# Patient Record
Sex: Female | Born: 1995 | Race: White | Hispanic: No | Marital: Single | Smoking: Never smoker
Health system: Southern US, Community
[De-identification: ages and names within clinical notes are randomized; demographics above are authoritative.]

## PROBLEM LIST (undated history)

## (undated) DIAGNOSIS — R569 Unspecified convulsions: Secondary | ICD-10-CM

---
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
## 2014-10-13 ENCOUNTER — Telehealth: Payer: Self-pay | Admitting: *Deleted

## 2014-10-13 NOTE — Telephone Encounter (Signed)
Santiago Glad, mom, stated the pt has seen Dr. Gaynell Face but has not seen him in the past year, since the pt is fine. The mother said they have to fill out the [facility] forms fill out this year to keep her license. The forms were filled out by this office last year. The mother would like to know if the forms can be filled out again this year. The pt does have a follow up with Dr[facility]ia [facility]uff at [facility] pt will see the doctor when the due date for the forms will pass. Or does she need to give it to Dr. Clovia Cuff. The mother can be reached at [phone].

## 2014-10-13 NOTE — Telephone Encounter (Signed)
She was last seen in April 2013. 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 Dr Gaynell Face, are you willing to complete the form without a visit this year? Otila Kluver

## 2014-10-13 NOTE — Telephone Encounter (Signed)
Erica Brandt, mom, stated that she got in contact with Dr. Dan Humphreys office. She said that they will fill out the [facility] form. She said there was no need to call back and said Thank you

## 2014-10-14 NOTE — Telephone Encounter (Signed)
Noted, thank you

## 2015-03-31 ENCOUNTER — Telehealth: Payer: Self-pay | Admitting: *Deleted

## 2015-03-31 NOTE — Telephone Encounter (Signed)
Unable to reach patient at time of Pre-Visit Call.  Left message for patient to return call when available.    

## 2015-04-01 ENCOUNTER — Encounter: Payer: Self-pay | Admitting: Family

## 2015-04-01 ENCOUNTER — Ambulatory Visit (INDEPENDENT_AMBULATORY_CARE_PROVIDER_SITE_OTHER): Payer: BLUE CROSS/BLUE SHIELD | Admitting: Family

## 2015-04-01 VITALS — BP 112/72 | HR 67 | Temp 97.2°F | Resp 16 | Ht 65.5 in | Wt 131.2 lb

## 2015-04-01 DIAGNOSIS — Z0000 Encounter for general adult medical examination without abnormal findings: Secondary | ICD-10-CM | POA: Diagnosis not present

## 2015-04-01 NOTE — Progress Notes (Signed)
Subjective:    Patient ID: Erica Brandt, female    DOB: 1996/04/14, 19 y.o.   MRN: [id number]  HPI  Erica Brandt is a 19 year old female who presents today to establish care.   Immunizations:   Diet:  Reports healthy diet.   Exercise: runs, [place] track, biking Reports that she had gardisil series.   Dental: reports last visit was 10/15, has apt next week.  Eye: due- advised to schedule routine eye exam  Seizures- age 58 was told simple partial seizures. Was on meds for a time. Reports that summer 2012 she has 40-50 in 1 week.  Had one episode with LOC.  Was told that she had a brain tumor, ganglioglioma 2012.  She was seen by neuro for 6-9 months.  She continues to see Erica Brandt at [facility] for [place].  Acne- on acutane- Sees Erica Brandt at [facility]. Not sexually active.     Review of Systems  Constitutional: Negative for unexpected weight change.  HENT: Negative for hearing loss and rhinorrhea.   Eyes: Negative for visual disturbance.  Respiratory: Negative for cough.   Cardiovascular: Negative for leg swelling.  Gastrointestinal: Negative for nausea, diarrhea and constipation.  Genitourinary: Negative for dysuria, frequency and menstrual problem.  Musculoskeletal: Negative for myalgias.       Mild joint pain which she attributes to accutane   Skin: Negative for rash.  Neurological: Negative for headaches.  Hematological: Negative for adenopathy.  Psychiatric/Behavioral: Negative for dysphoric mood and agitation.   Past Medical History  Diagnosis Date  . Seizures     last one 02/2012 r/t soccer accident- no meds f/u w/ neuro all Normal; most seizures prior to brain surgery    History   Social History  . Marital Status: Single    Spouse Name: N/A  . Number of Children: N/A  . Years of Education: N/A   Occupational History  . Not on file.   Social History Main Topics  . Smoking status: Never Smoker   . Smokeless tobacco: Never  Used  . Alcohol Use: No  . Drug Use: No  . Sexual Activity: No   Other Topics Concern  . Not on file   Social History Narrative   Lives with mom, dad, brother and sister   Erica Brandt- starting nursing program   Enjoys reading, piano, works at a summer camp, staying active, spending time with friends/family   Dog        Past Surgical History  Procedure Laterality Date  . Hymenectomy  11/30/2012    Procedure: HYMENECTOMY;  Surgeon: Erica Kenner, DO;  Location: [facility];  Service: Gynecology;  Laterality: N/A;  . Brain tumor excision  06/27/11    Family History  Problem Relation Age of Onset  . Hyperlipidemia Mother     diet controlled  . Cancer Paternal Grandfather     metastatic prostate cancer    No Known Allergies  No current outpatient prescriptions on file prior to visit.   No current facility-administered medications on file prior to visit.    BP 112/72 mmHg  Pulse 67  Temp(Src) 97.2 F (36.2 C) (Oral)  Resp 16  Ht 5' 5.5" (1.664 m)  Wt 131 lb 3.2 oz (59.512 kg)  BMI 21.49 kg/m2  SpO2 98%  LMP 03/02/2015       Objective:   Physical Exam  Physical Exam  Constitutional: She is oriented to person, place, and time. She appears well-developed and well-nourished. No distress.  HENT:  Head: Normocephalic and atraumatic.  Right Ear: Tympanic membrane and ear canal normal.  Left Ear: Tympanic membrane and ear canal normal.  Mouth/Throat: Oropharynx is clear and moist.  Eyes: Pupils are equal, round, and reactive to light. No scleral icterus.  Neck: Normal range of motion. No thyromegaly present.  Cardiovascular: Normal rate and regular rhythm.   No murmur heard. Pulmonary/Chest: Effort normal and breath sounds normal. No respiratory distress. He has no wheezes. She has no rales. She exhibits no tenderness.  Abdominal: Soft. Bowel sounds are normal. He exhibits no distension and no mass. There is no tenderness. There is no rebound and no guarding.    Musculoskeletal: She exhibits no edema.  Lymphadenopathy:    She has no cervical adenopathy.  Neurological: She is alert and oriented to person, place, and time. She has normal patellar reflexes. She exhibits normal muscle tone. Coordination normal.  Skin: Skin is warm and dry.  Psychiatric: She has a normal mood and affect. Her behavior is normal. Judgment and thought content normal.  Breasts: Examined lying Right: Without masses, retractions, discharge or axillary adenopathy.  Left: Without masses, retractions, discharge or axillary adenopathy.  Pelvic: deferred        Assessment & Plan:         Assessment & Plan:

## 2015-04-01 NOTE — Patient Instructions (Signed)
Continue healthy diet, exercise. Please bring [place] copies of your immunizations for review. Follow up in 1 year, sooner if problems/concerns.

## 2015-04-01 NOTE — Progress Notes (Signed)
Pre visit review using our clinic review tool, if applicable. No additional management support is needed unless otherwise documented below in the visit note. 

## 2015-04-01 NOTE — Assessment & Plan Note (Addendum)
Continue healthy diet, exercise. Advised pt not to become pregnant on accutane and she verbalizes understanding. Obtain routine lab work. Paps to start at 20- pt has a gyn.

## 2015-04-02 LAB — BASIC METABOLIC PANEL
BUN: 10 mg/dL (ref 6–23)
CO2: 31 mEq/L (ref 19–32)
CREATININE: 0.88 mg/dL (ref 0.40–1.20)
Calcium: 9.9 mg/dL (ref 8.4–10.5)
Chloride: 104 mEq/L (ref 96–112)
GFR: 86.79 mL/min (ref >60.00)
GLUCOSE: 84 mg/dL (ref 70–99)
POTASSIUM: 4.2 meq/L (ref 3.5–5.1)
Sodium: 142 mEq/L (ref 135–145)

## 2015-04-02 LAB — CBC WITH DIFFERENTIAL/PLATELET
BASOS ABS: 0.1 10*3/uL (ref 0.0–0.1)
BASOS PCT: 0.8 % (ref 0.0–3.0)
Eosinophils Absolute: 0.1 10*3/uL (ref 0.0–0.7)
Eosinophils Relative: 1.7 % (ref 0.0–5.0)
HCT: 37.5 % (ref 36.0–46.0)
HEMOGLOBIN: 12.8 g/dL (ref 12.0–15.0)
LYMPHS PCT: 29.8 % (ref 12.0–46.0)
Lymphs Abs: 2.1 10*3/uL (ref 0.7–4.0)
MCHC: 34.2 g/dL (ref 30.0–36.0)
MCV: 90.4 fl (ref 78.0–100.0)
MONOS PCT: 9.3 % (ref 3.0–12.0)
Monocytes Absolute: 0.7 10*3/uL (ref 0.1–1.0)
NEUTROS ABS: 4.1 10*3/uL (ref 1.4–7.7)
Neutrophils Relative %: 58.4 % (ref 43.0–77.0)
Platelets: 258 10*3/uL (ref 150.0–400.0)
RBC: 4.15 Mil/uL (ref 3.87–5.11)
RDW: 12.5 % (ref 11.5–14.6)
WBC: 7 10*3/uL (ref 4.5–10.5)

## 2015-04-02 LAB — LIPID PANEL
CHOLESTEROL: 127 mg/dL (ref 0–200)
HDL: 41.4 mg/dL (ref >39.00)
LDL CALC: 69 mg/dL (ref 0–99)
NONHDL: 85.6
Total CHOL/HDL Ratio: 3
Triglycerides: 81 mg/dL (ref 0.0–149.0)
VLDL: 16.2 mg/dL (ref 0.0–40.0)

## 2015-04-02 LAB — URINALYSIS, ROUTINE W REFLEX MICROSCOPIC
BILIRUBIN URINE: NEGATIVE
Hgb urine dipstick: NEGATIVE
KETONES UR: NEGATIVE
Leukocytes, UA: NEGATIVE
NITRITE: NEGATIVE
SPECIFIC GRAVITY, URINE: 1.01 (ref 1.000–1.030)
Total Protein, Urine: NEGATIVE
Urine Glucose: NEGATIVE
Urobilinogen, UA: 0.2 (ref 0.0–1.0)
WBC, UA: NONE SEEN (ref 0–?)
pH: 7.5 (ref 5.0–8.0)

## 2015-04-02 LAB — HEPATIC FUNCTION PANEL
ALK PHOS: 94 U/L (ref 39–117)
ALT: 18 U/L (ref 0–35)
AST: 22 U/L (ref 0–37)
Albumin: 4.2 g/dL (ref 3.5–5.2)
BILIRUBIN DIRECT: 0.1 mg/dL (ref 0.0–0.3)
BILIRUBIN TOTAL: 0.4 mg/dL (ref 0.2–1.2)
Total Protein: 7.5 g/dL (ref 6.0–8.3)

## 2015-04-02 LAB — TSH: TSH: 1.37 u[IU]/mL (ref 0.35–5.50)

## 2015-04-03 ENCOUNTER — Encounter: Payer: Self-pay | Admitting: Family

## 2015-04-03 LAB — QUANTIFERON TB GOLD ASSAY (BLOOD)
INTERFERON GAMMA RELEASE ASSAY: NEGATIVE
QUANTIFERON NIL VALUE: 0.02 [IU]/mL
Quantiferon Tb Ag Minus Nil Value: 0.01 IU/mL
TB Ag value: 0.03 IU/mL

## 2015-11-26 ENCOUNTER — Telehealth: Payer: Self-pay | Admitting: Family

## 2015-11-26 DIAGNOSIS — Z87898 Personal history of other specified conditions: Secondary | ICD-10-CM

## 2015-11-26 NOTE — Telephone Encounter (Signed)
Relation to pt: self  Call back number:[phone] :  Reason for call:  Patient requesting a order for MRI at [facility] [address] [place], [place], [place] [address] [phone], routine check up regarding brain tumor. Patient appoitment is Tuesday 11/30/14 at 8:30a.    [facility]  ID # [id number]  Group # [id number] PCP line # [id number], [phone]

## 2015-11-27 NOTE — Telephone Encounter (Signed)
Please advise. Dr. Larose Kells covering today, however, new patient to you.

## 2016-04-20 ENCOUNTER — Telehealth: Payer: Self-pay | Admitting: Family

## 2016-04-20 DIAGNOSIS — Z111 Encounter for screening for respiratory tuberculosis: Secondary | ICD-10-CM

## 2016-04-20 NOTE — Telephone Encounter (Signed)
OK to come to the lab. Order placed.

## 2016-04-20 NOTE — Telephone Encounter (Signed)
Can be reached: [phone]   Reason for call: Pt called stating she needs tb quantiferon testing for school. Last OV 04/01/2015. Can we schedule for lab or does she need OV before?

## 2016-04-20 NOTE — Telephone Encounter (Signed)
Left detailed message on cell re: order placement and to call and schedule lab appt.

## 2016-04-26 ENCOUNTER — Other Ambulatory Visit (INDEPENDENT_AMBULATORY_CARE_PROVIDER_SITE_OTHER): Payer: 59

## 2016-04-26 DIAGNOSIS — Z111 Encounter for screening for respiratory tuberculosis: Secondary | ICD-10-CM | POA: Diagnosis not present

## 2016-04-28 LAB — QUANTIFERON TB GOLD ASSAY (BLOOD)
Interferon Gamma Release Assay: NEGATIVE
Mitogen-Nil: 8.57 IU/mL
QUANTIFERON TB AG MINUS NIL: 0 [IU]/mL
Quantiferon Nil Value: 0.02 IU/mL

## 2016-04-29 ENCOUNTER — Encounter: Payer: Self-pay | Admitting: Family

## 2016-07-12 ENCOUNTER — Telehealth: Payer: Self-pay

## 2016-07-12 NOTE — Telephone Encounter (Signed)
Patient requesting an appointment for  Tetanus/Tdap  Influenza Vaccine   Will she need an office visit of nurse visit for this appointment?

## 2016-07-13 ENCOUNTER — Ambulatory Visit (INDEPENDENT_AMBULATORY_CARE_PROVIDER_SITE_OTHER): Payer: 59 | Admitting: *Deleted

## 2016-07-13 DIAGNOSIS — Z23 Encounter for immunization: Secondary | ICD-10-CM | POA: Diagnosis not present

## 2016-07-13 NOTE — Progress Notes (Signed)
Pre visit review using our clinic review tool, if applicable. No additional management support is needed unless otherwise documented below in the visit note.  Patient tolerated injection well.  Dekisha Mesmer J Bellina Tokarczyk, RN 

## 2016-07-13 NOTE — Telephone Encounter (Signed)
Nurse visit please

## 2017-01-02 ENCOUNTER — Encounter: Payer: Self-pay | Admitting: Family

## 2017-01-02 DIAGNOSIS — Z8249 Family history of ischemic heart disease and other diseases of the circulatory system: Secondary | ICD-10-CM

## 2017-01-23 ENCOUNTER — Encounter: Payer: Self-pay | Admitting: Internal Medicine

## 2017-01-23 ENCOUNTER — Ambulatory Visit (INDEPENDENT_AMBULATORY_CARE_PROVIDER_SITE_OTHER): Payer: 59 | Admitting: Internal Medicine

## 2017-01-23 VITALS — BP 124/70 | HR 68 | Ht 66.0 in | Wt 142.4 lb

## 2017-01-23 DIAGNOSIS — Z8241 Family history of sudden cardiac death: Secondary | ICD-10-CM

## 2017-01-23 NOTE — Progress Notes (Signed)
Cardiology Office Note   Date:  01/23/2017   ID:  Erica Brandt, DOB [birth year], MRN [id number]  PCP:  Nance Pear., NP  Cardiologist:   Dorris Carnes, MD   Presetns for eval.  FHx of VF arrest      History of Present Illness: Erica Brandt is a 21 y.o. female with a history of seizures, gangioglioma  [facility] and in [facility]  Self referred  Mother had cardiac arrest    Fhx of cardac arrest  (mother)  In 2016  Prolonged QT  Pt deneis palpitations  Active  No dizziness  No syncope   Senior in nursing     No outpatient prescriptions have been marked as taking for the 01/23/17 encounter (Office Visit) with Fay Records, MD.     Allergies:   Patient has no known allergies.   Past Medical History:  Diagnosis Date  . Seizures ([place])    last one 02/2012 r/t soccer accident- no meds f/u w/ neuro all Normal; most seizures prior to brain surgery    Past Surgical History:  Procedure Laterality Date  . 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 HYMENECTOMY  11/30/2012   Procedure: HYMENECTOMY;  Surgeon: Allyn Kenner, DO;  Location: [facility];  Service: Gynecology;  Laterality: N/A;     Social History:  The patient  reports that she has never smoked. She has never used smokeless tobacco. She reports that she does not drink alcohol or use drugs.   Family History:  The patient's family history includes Cancer in her paternal grandfather; Heart attack in her mother; Hyperlipidemia in her mother.    ROS:  Please see the history of present illness. All other systems are reviewed and  Negative to the above problem except as noted.    PHYSICAL EXAM: VS:  BP 124/70   Pulse 68   Ht 5\' 6"  (1.676 m)   Wt 142 lb 6.4 oz (64.6 kg)   BMI 22.98 kg/m   GEN: Well nourished, well developed, in no acute distress  HEENT: normal  Neck: no JVD, carotid bruits, or masses Cardiac: RRR; no murmurs, rubs, or gallops,no edema  Respiratory:  clear  to auscultation bilaterally, normal work of breathing GI: soft, nontender, nondistended, + BS  No hepatomegaly  MS: no deformity Moving all extremities   Skin: warm and dry, no rash Neuro:  Strength and sensation are intact Psych: euthymic mood, full affect   EKG:  EKG is ordered today.  SR 69  QTc 412     Lipid Panel    Component Value Date/Time   CHOL 127 04/01/2015 1436   TRIG 81.0 04/01/2015 1436   HDL 41.40 04/01/2015 1436   CHOLHDL 3 04/01/2015 1436   VLDL 16.2 04/01/2015 1436   LDLCALC 69 04/01/2015 1436      Wt Readings from Last 3 Encounters:  01/23/17 142 lb 6.4 oz (64.6 kg)  04/01/15 131 lb 3.2 oz (59.5 kg)  11/27/12 135 lb (61.2 kg) (69 %, Z= 0.50)*   * Growth percentiles are based on CDC 2-20 Years data.      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  The patient does not have concerns regarding medicines.  Signed, Dorris Carnes, MD  01/23/2017 11:14 AM    [facility] [place], [place], [place]  [address] Phone: [phone]; Fax: [phone]

## 2017-01-23 NOTE — Patient Instructions (Signed)
Your physician has requested that you have an echocardiogram. Echocardiography is a painless test that uses sound waves to create images of your heart. It provides your doctor with information about the size and shape of your heart and how well your heart's chambers and valves are working. This procedure takes approximately one hour. There are no restrictions for this procedure.  Follow up with your physician will depend on test results.

## 2017-01-24 ENCOUNTER — Encounter: Payer: Self-pay | Admitting: Family

## 2017-01-24 DIAGNOSIS — L709 Acne, unspecified: Secondary | ICD-10-CM

## 2017-02-08 ENCOUNTER — Other Ambulatory Visit: Payer: Self-pay

## 2017-02-08 ENCOUNTER — Ambulatory Visit (HOSPITAL_COMMUNITY): Payer: 59 | Attending: Cardiovascular Disease

## 2017-02-08 DIAGNOSIS — Z8241 Family history of sudden cardiac death: Secondary | ICD-10-CM | POA: Diagnosis not present

## 2017-02-08 DIAGNOSIS — I341 Nonrheumatic mitral (valve) prolapse: Secondary | ICD-10-CM | POA: Diagnosis not present

## 2017-02-20 NOTE — Telephone Encounter (Signed)
Melissa--pt's insurance requires a referral from PCP. I have pended referral. I did not include a diagnosis due to below message from pt and was unsure of what to use? Please advise?

## 2017-02-20 NOTE — Telephone Encounter (Signed)
Relation to pt: self Call back number: [phone]    Reason for call:  Patient checking on the status of Dr, Mechele Dawley dermatology referral due to acne and keloids on left shoulder. Patient has a scheduled appointment on Wednesday 02/22/17. Patient would like to a call when referral has been sent.

## 2017-02-22 DIAGNOSIS — L700 Acne vulgaris: Secondary | ICD-10-CM | POA: Diagnosis not present

## 2017-02-22 DIAGNOSIS — L910 Hypertrophic scar: Secondary | ICD-10-CM | POA: Diagnosis not present

## 2017-06-05 ENCOUNTER — Ambulatory Visit (INDEPENDENT_AMBULATORY_CARE_PROVIDER_SITE_OTHER): Payer: 59 | Admitting: Family

## 2017-06-05 ENCOUNTER — Encounter: Payer: Self-pay | Admitting: Family

## 2017-06-05 ENCOUNTER — Other Ambulatory Visit (HOSPITAL_COMMUNITY)
Admission: RE | Admit: 2017-06-05 | Discharge: 2017-06-05 | Disposition: A | Discharge status: Home / Self Care | Payer: 59 | Source: Ambulatory Visit | Attending: Family Medicine | Admitting: Family Medicine

## 2017-06-05 VITALS — BP 108/71 | HR 75 | Temp 98.5°F | Resp 16 | Ht 66.0 in | Wt 136.6 lb

## 2017-06-05 DIAGNOSIS — L739 Follicular disorder, unspecified: Secondary | ICD-10-CM | POA: Diagnosis not present

## 2017-06-05 DIAGNOSIS — Z0000 Encounter for general adult medical examination without abnormal findings: Secondary | ICD-10-CM

## 2017-06-05 DIAGNOSIS — Z01419 Encounter for gynecological examination (general) (routine) without abnormal findings: Secondary | ICD-10-CM | POA: Diagnosis not present

## 2017-06-05 DIAGNOSIS — Z0001 Encounter for general adult medical examination with abnormal findings: Secondary | ICD-10-CM

## 2017-06-05 MED ORDER — CEPHALEXIN 500 MG PO CAPS: 500.0000 mg | ORAL_CAPSULE | Freq: Three times a day (TID) | ORAL | 0 refills | Status: DC

## 2017-06-05 NOTE — Progress Notes (Signed)
Subjective:    Patient ID: Erica Brandt, female    DOB: 1996/03/13, 21 y.o.   MRN: [id number]  HPI  Erica Brandt is a 21 yr old female who presents today for complete physical.   Immunizations: tdap up to date Diet:  healthy Exercise: enjoys running/weights, nodic track Pap Smear: due Vision: due Dental: up to date  Wt Readings from Last 3 Encounters:  06/05/17 136 lb 9.6 oz (62 kg)  01/23/17 142 lb 6.4 oz (64.6 kg)  04/01/15 131 lb 3.2 oz (59.5 kg)    Ingrown hair- has been there for several months.  Would like it evaluated.    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  MRI showed postsurgical changes in the right anterior temporal lobe. Otherwise was unremarkable. She is followed by Dr. Tivis Ringer.  She told me that she is following with him every other year at this point per his recommendation.   Review of Systems  Constitutional: Negative for unexpected weight change.  HENT: Negative for hearing loss and rhinorrhea.   Eyes: Negative for visual disturbance.  Respiratory: Negative for cough.   Cardiovascular: Negative for leg swelling.  Gastrointestinal: Negative for constipation and diarrhea.  Genitourinary: Negative for dysuria and frequency.  Musculoskeletal: Negative for arthralgias and myalgias.  Neurological:       Reports some mild headaches this week  Hematological: Negative for adenopathy.  Psychiatric/Behavioral:       Denies depression/anxiety       Past Medical History:  Diagnosis Date  . Seizures ([place])    last one 02/2012 r/t soccer accident- no meds f/u w/ neuro all Normal; most seizures prior to brain surgery     Social History   Social History  . Marital status: Single    Spouse name: N/A  . Number of children: N/A  . Years of education: N/A   Occupational History  . Not on file.   Social History Main Topics  . Smoking status: Never Smoker  . Smokeless tobacco: Never Used  . Alcohol use No  . Drug use: No    . Sexual activity: No   Other Topics Concern  . 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 HYMENECTOMY  11/30/2012   Procedure: HYMENECTOMY;  Surgeon: Allyn Kenner, DO;  Location: [facility];  Service: Gynecology;  Laterality: N/A;    Family History  Problem Relation Age of Onset  . Hyperlipidemia Mother        diet controlled  . Heart attack Mother   . Cancer Paternal Grandfather        metastatic prostate cancer    No Known Allergies  No current outpatient prescriptions on file prior to visit.   No current facility-administered medications on file prior to visit.     BP 108/71 (BP Location: Left Arm, Cuff Size: Normal)   Pulse 75   Temp 98.5 F (36.9 C) (Oral)   Resp 16   Ht 5\' 6"  (1.676 m)   Wt 136 lb 9.6 oz (62 kg)   LMP 04/30/2017   SpO2 98%   BMI 22.05  kg/m     Objective:   Physical Exam Physical Exam  Constitutional: She is oriented to person, place, and time. She appears well-developed and well-nourished. No distress.  HENT:  Head: Normocephalic and atraumatic.  Right Ear: Tympanic membrane and ear canal normal.  Left Ear: Tympanic membrane and ear canal normal.  Mouth/Throat: Oropharynx is clear and moist.  Eyes: Pupils are equal, round, and reactive to light. No scleral icterus.  Neck: Normal range of motion. No thyromegaly present.  Cardiovascular: Normal rate and regular rhythm.   No murmur heard. Pulmonary/Chest: Effort normal and breath sounds normal. No respiratory distress. He has no wheezes. She has no rales. She exhibits no tenderness.  Abdominal: Soft. Bowel sounds are normal. She exhibits no distension and no mass. There is no tenderness. There is  no rebound and no guarding.  Musculoskeletal: She exhibits no edema.  Lymphadenopathy:    She has no cervical adenopathy.  Neurological: She is alert and oriented to person, place, and time. She has normal patellar reflexes. She exhibits normal muscle tone. Coordination normal.  Skin: Skin is warm and dry. small pea sized ingrown hair follicle noted left groin Psychiatric: She has a normal mood and affect. Her behavior is normal. Judgment and thought content normal.  Breasts: Examined lying Right: Without masses, retractions, discharge or axillary adenopathy.  Left: Without masses, retractions, discharge or axillary adenopathy.  Inguinal/mons: Normal without inguinal adenopathy  External genitalia: Normal  BUS/Urethra/Skene's glands: Normal  Bladder: Normal  Vagina: Normal  Cervix: Normal  Uterus: normal in size, shape and contour. Midline and mobile  Adnexa/parametria:  Rt: Without masses or tenderness.  Lt: Without masses or tenderness.  Anus and perineum: Normal            Assessment & Plan:    Folliculitis- Rx with keflex and warm compresses bid. Pt is advised to call if no improvement.   Preventative care visit with abnormal finding- encouraged patient to continue healthy diet and regular exercise. Pap is performed today. She reports that she has never been sexually active in the past. Immunizations reviewed and up-to-date.  Discussed BSE.  Assessment & Plan:

## 2017-06-05 NOTE — Addendum Note (Signed)
Addended by: Kelle Darting A on: 06/05/2017 04:49 PM   Modules accepted: Orders

## 2017-06-05 NOTE — Patient Instructions (Addendum)
Please schedule a routine eye exam.   Continue healthy diet and regular exercise.  Begin keflex for ingrown hair. Let me know if symptoms worsen of fail to improve.

## 2017-06-06 LAB — BASIC METABOLIC PANEL
BUN: 20 mg/dL (ref 6–23)
CALCIUM: 9.5 mg/dL (ref 8.4–10.5)
CO2: 26 mEq/L (ref 19–32)
CREATININE: 1.07 mg/dL (ref 0.40–1.20)
Chloride: 103 mEq/L (ref 96–112)
GFR: 67.84 mL/min (ref >60.00)
Glucose, Bld: 61 mg/dL — ABNORMAL LOW (ref 70–99)
Potassium: 4 mEq/L (ref 3.5–5.1)
Sodium: 139 mEq/L (ref 135–145)

## 2017-06-06 LAB — URINALYSIS, ROUTINE W REFLEX MICROSCOPIC
Bilirubin Urine: NEGATIVE
HGB URINE DIPSTICK: NEGATIVE
Ketones, ur: NEGATIVE
Leukocytes, UA: NEGATIVE
Nitrite: NEGATIVE
RBC / HPF: NONE SEEN (ref 0–?)
SPECIFIC GRAVITY, URINE: 1.02 (ref 1.000–1.030)
Total Protein, Urine: NEGATIVE
URINE GLUCOSE: NEGATIVE
UROBILINOGEN UA: 0.2 (ref 0.0–1.0)
pH: 6.5 (ref 5.0–8.0)

## 2017-06-06 LAB — LIPID PANEL
CHOL/HDL RATIO: 3
Cholesterol: 119 mg/dL (ref 0–200)
HDL: 41.7 mg/dL (ref >39.00)
LDL CALC: 62 mg/dL (ref 0–99)
NONHDL: 77.77
Triglycerides: 81 mg/dL (ref 0.0–149.0)
VLDL: 16.2 mg/dL (ref 0.0–40.0)

## 2017-06-06 LAB — CBC WITH DIFFERENTIAL/PLATELET
BASOS ABS: 0 10*3/uL (ref 0.0–0.1)
Basophils Relative: 0.5 % (ref 0.0–3.0)
EOS ABS: 0.2 10*3/uL (ref 0.0–0.7)
Eosinophils Relative: 2.6 % (ref 0.0–5.0)
HCT: 38.2 % (ref 36.0–46.0)
Hemoglobin: 12.9 g/dL (ref 12.0–15.0)
Lymphocytes Relative: 29.6 % (ref 12.0–46.0)
Lymphs Abs: 2.6 10*3/uL (ref 0.7–4.0)
MCHC: 33.8 g/dL (ref 30.0–36.0)
MCV: 90.9 fl (ref 78.0–100.0)
MONO ABS: 0.7 10*3/uL (ref 0.1–1.0)
Monocytes Relative: 7.9 % (ref 3.0–12.0)
NEUTROS ABS: 5.2 10*3/uL (ref 1.4–7.7)
Neutrophils Relative %: 59.4 % (ref 43.0–77.0)
PLATELETS: 288 10*3/uL (ref 150.0–400.0)
RBC: 4.21 Mil/uL (ref 3.87–5.11)
RDW: 11.8 % (ref 11.5–15.5)
WBC: 8.7 10*3/uL (ref 4.0–10.5)

## 2017-06-06 LAB — HEPATIC FUNCTION PANEL
ALK PHOS: 91 U/L (ref 39–117)
ALT: 27 U/L (ref 0–35)
AST: 26 U/L (ref 0–37)
Albumin: 4.2 g/dL (ref 3.5–5.2)
BILIRUBIN DIRECT: 0.1 mg/dL (ref 0.0–0.3)
BILIRUBIN TOTAL: 0.5 mg/dL (ref 0.2–1.2)
TOTAL PROTEIN: 6.9 g/dL (ref 6.0–8.3)

## 2017-06-06 LAB — TSH: TSH: 1.07 u[IU]/mL (ref 0.35–4.50)

## 2017-06-07 LAB — CYTOLOGY - PAP: DIAGNOSIS: NEGATIVE

## 2017-09-29 ENCOUNTER — Other Ambulatory Visit: Payer: Self-pay | Admitting: Family

## 2017-09-29 ENCOUNTER — Encounter: Payer: Self-pay | Admitting: Family

## 2017-09-29 DIAGNOSIS — G9389 Other specified disorders of brain: Secondary | ICD-10-CM

## 2018-01-09 ENCOUNTER — Encounter: Payer: Self-pay | Admitting: Family

## 2018-01-09 DIAGNOSIS — L910 Hypertrophic scar: Secondary | ICD-10-CM

## 2018-01-09 NOTE — Telephone Encounter (Signed)
See mychart message. Could you please place referral?

## 2018-01-09 NOTE — Telephone Encounter (Signed)
Erica Brandt-- pt says she has to have referral for insurance purposes. I placed referral. She already has appt next Friday. Is anything else needed?

## 2018-01-25 ENCOUNTER — Telehealth: Payer: Self-pay | Admitting: Family

## 2018-01-25 DIAGNOSIS — L709 Acne, unspecified: Secondary | ICD-10-CM

## 2018-01-25 NOTE — Telephone Encounter (Signed)
Replaced referral

## 2018-01-25 NOTE — Telephone Encounter (Signed)
Spoke with patient in regards to an issue with her dermatology referral. Patients referral was placed wrong with [facility] and she was unable to be seen for her appt after waiting for 2 hours. [employer] and the patient spoke with [facility] in our office and she was unable/unwilling to update of change the referral based on what they were telling her. I have apologized for the patients experience and promised to make sure this gets taken care of tomorrow so that the patient can get her appointment rescheduled. Patient was understanding and appreciative.     Melissa can you place another Dermatology referral for this patient , she sees Dr. Harriett Sine at [facility] derm and I will work with Meredith Mody to make sure this is entered correctly in [facility].

## 2018-02-26 DIAGNOSIS — L910 Hypertrophic scar: Secondary | ICD-10-CM | POA: Diagnosis not present

## 2018-02-26 DIAGNOSIS — L700 Acne vulgaris: Secondary | ICD-10-CM | POA: Diagnosis not present

## 2018-04-09 DIAGNOSIS — L910 Hypertrophic scar: Secondary | ICD-10-CM | POA: Diagnosis not present

## 2018-05-23 ENCOUNTER — Encounter (HOSPITAL_COMMUNITY): Payer: Self-pay | Admitting: Emergency Medicine

## 2018-05-23 ENCOUNTER — Ambulatory Visit (HOSPITAL_COMMUNITY)
Admission: EM | Admit: 2018-05-23 | Discharge: 2018-05-23 | Disposition: A | Discharge status: Home / Self Care | Payer: 59 | Attending: Family Medicine | Admitting: Family Medicine

## 2018-05-23 DIAGNOSIS — L03011 Cellulitis of right finger: Secondary | ICD-10-CM | POA: Diagnosis not present

## 2018-05-23 MED ORDER — CEPHALEXIN 500 MG PO CAPS: 500.0000 mg | ORAL_CAPSULE | Freq: Four times a day (QID) | ORAL | 0 refills | Status: DC

## 2018-05-23 NOTE — ED Triage Notes (Signed)
Pt c/o redness and swelling, pus drainage from R pointer finger.

## 2018-05-23 NOTE — ED Provider Notes (Signed)
[place]    CSN: [id number] Arrival date & time: 05/23/18  1036     History   Chief Complaint Chief Complaint  Patient presents with  . Finger Pain    HPI Erica Brandt is a 22 y.o. female.   22 year old female comes in for a few day history of swelling to the right index finger. States has also had redness and pain. This morning, had purulent drainage. Denies spreading erythema. Denies fever, chills, night sweats. Denies injury/trauma. Has not taken anything for it.      Past Medical History:  Diagnosis Date  . Seizures ([place])    last one 02/2012 r/t soccer accident- no meds f/u w/ neuro all Normal; most seizures prior to brain surgery    Patient Active Problem List   Diagnosis Date Noted  . Preventative health care 04/01/2015    Past Surgical History:  Procedure Laterality Date  . 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 HYMENECTOMY  11/30/2012   Procedure: HYMENECTOMY;  Surgeon: Allyn Kenner, DO;  Location: [facility];  Service: Gynecology;  Laterality: N/A;    OB History   None      Home Medications    Prior to Admission medications   Medication Sig Start Date End Date Taking? Authorizing Provider  cephALEXin (KEFLEX) 500 MG capsule Take 1 capsule (500 mg total) by mouth 4 (four) times daily. 05/23/18   Tasia Catchings, Arjuna Doeden V, PA-C  Lysine HCl 500 MG TABS Take 1 tablet by mouth 2 (two) times daily.    [provider]    Family History Family History  Problem Relation Age of Onset  . Hyperlipidemia Mother        diet controlled  . Heart attack Mother   . Cancer Paternal Grandfather        metastatic prostate cancer    Social History Social History   Tobacco Use  . Smoking status: Never Smoker  . Smokeless tobacco: Never Used  Substance Use Topics  . Alcohol use: No    Alcohol/week: 0.0 oz  . Drug use: No     Allergies   Patient has no known allergies.   Review of Systems Review of Systems  Reason unable to perform  ROS: See HPI as above.     Physical Exam Triage Vital Signs ED Triage Vitals [05/23/18 1100]  Enc Vitals Group     BP 116/75     Pulse Rate 63     Resp 18     Temp 98.3 F (36.8 C)     Temp src      SpO2 100 %     Weight      Height      Head Circumference      Peak Flow      Pain Score      Pain Loc      Pain Edu?      Excl. in [place]?    No data found.  Updated Vital Signs BP 116/75   Pulse 63   Temp 98.3 F (36.8 C)   Resp 18   SpO2 100%   Physical Exam  Constitutional: She is oriented to person, place, and time. She appears well-developed and well-nourished. No distress.  HENT:  Head: Normocephalic and atraumatic.  Eyes: Pupils are equal, round, and reactive to light. Conjunctivae are normal.  Musculoskeletal:  Erythema and swelling around nailbed of the right index finger.  No paronychia noted.  Tenderness to palpation along the  nailbed.  Full range of motion of finger, strength normal and equal bilaterally.  Sensation intact ankle bilaterally.  Radial pulse 2+ and equal bilaterally.  Cap refill less than 2 seconds.  Neurological: She is alert and oriented to person, place, and time.     UC Treatments / Results  Labs (all labs ordered are listed, but only abnormal results are displayed) Labs Reviewed - No data to display  EKG None  Radiology No results found.  Procedures Procedures (including critical care time)  Medications Ordered in UC Medications - No data to display  Initial Impression / Assessment and Plan / UC Course  I have reviewed the triage vital signs and the nursing notes.  Pertinent labs & imaging results that were available during my care of the patient were reviewed by me and considered in my medical decision making (see chart for details).    Will treat for cellulitis around the nailbed, possible paronychia that self drained.  Start Keflex as directed.  Warm compress.  Return precautions given.  Patient expresses understanding and  agrees to plan.  Final Clinical Impressions(s) / UC Diagnoses   Final diagnoses:  Cellulitis of finger of right hand    ED Prescriptions    Medication Sig Dispense Auth. Provider   cephALEXin (KEFLEX) 500 MG capsule Take 1 capsule (500 mg total) by mouth 4 (four) times daily. 28 capsule Tobin Chad, [place] 05/23/18 1205

## 2018-05-23 NOTE — Discharge Instructions (Signed)
Start keflex as directed. Warm compress. Follow up for reevaluation if redness is spreading, having fever, discoloration to the finger.

## 2018-06-01 DIAGNOSIS — L910 Hypertrophic scar: Secondary | ICD-10-CM | POA: Diagnosis not present

## 2018-06-06 ENCOUNTER — Ambulatory Visit (INDEPENDENT_AMBULATORY_CARE_PROVIDER_SITE_OTHER): Payer: 59 | Admitting: Family

## 2018-06-06 ENCOUNTER — Encounter: Payer: Self-pay | Admitting: Family

## 2018-06-06 ENCOUNTER — Other Ambulatory Visit: Payer: Self-pay | Admitting: Family

## 2018-06-06 VITALS — BP 112/73 | HR 57 | Temp 98.3°F | Resp 16 | Ht 66.0 in | Wt 143.4 lb

## 2018-06-06 DIAGNOSIS — Z0000 Encounter for general adult medical examination without abnormal findings: Secondary | ICD-10-CM

## 2018-06-06 DIAGNOSIS — E870 Hyperosmolality and hypernatremia: Secondary | ICD-10-CM

## 2018-06-06 LAB — TSH: TSH: 1.96 u[IU]/mL (ref 0.35–4.50)

## 2018-06-06 LAB — LIPID PANEL
CHOLESTEROL: 116 mg/dL (ref 0–200)
HDL: 43.8 mg/dL (ref >39.00)
LDL CALC: 63 mg/dL (ref 0–99)
NonHDL: 71.76
Total CHOL/HDL Ratio: 3
Triglycerides: 42 mg/dL (ref 0.0–149.0)
VLDL: 8.4 mg/dL (ref 0.0–40.0)

## 2018-06-06 LAB — URINALYSIS, ROUTINE W REFLEX MICROSCOPIC
BILIRUBIN URINE: NEGATIVE
HGB URINE DIPSTICK: NEGATIVE
KETONES UR: NEGATIVE
NITRITE: NEGATIVE
Specific Gravity, Urine: 1.025 (ref 1.000–1.030)
Total Protein, Urine: NEGATIVE
URINE GLUCOSE: NEGATIVE
Urobilinogen, UA: 0.2 (ref 0.0–1.0)
pH: 6 (ref 5.0–8.0)

## 2018-06-06 LAB — CBC WITH DIFFERENTIAL/PLATELET
BASOS ABS: 0 10*3/uL (ref 0.0–0.1)
Basophils Relative: 0.7 % (ref 0.0–3.0)
Eosinophils Absolute: 0.2 10*3/uL (ref 0.0–0.7)
Eosinophils Relative: 3.8 % (ref 0.0–5.0)
HCT: 38.2 % (ref 36.0–46.0)
Hemoglobin: 12.9 g/dL (ref 12.0–15.0)
LYMPHS ABS: 2 10*3/uL (ref 0.7–4.0)
Lymphocytes Relative: 34.1 % (ref 12.0–46.0)
MCHC: 33.7 g/dL (ref 30.0–36.0)
MCV: 92.9 fl (ref 78.0–100.0)
MONO ABS: 0.7 10*3/uL (ref 0.1–1.0)
Monocytes Relative: 11.4 % (ref 3.0–12.0)
NEUTROS ABS: 2.9 10*3/uL (ref 1.4–7.7)
NEUTROS PCT: 50 % (ref 43.0–77.0)
PLATELETS: 257 10*3/uL (ref 150.0–400.0)
RBC: 4.11 Mil/uL (ref 3.87–5.11)
RDW: 12.4 % (ref 11.5–15.5)
WBC: 5.8 10*3/uL (ref 4.0–10.5)

## 2018-06-06 LAB — HEPATIC FUNCTION PANEL
ALBUMIN: 4.1 g/dL (ref 3.5–5.2)
ALK PHOS: 93 U/L (ref 39–117)
ALT: 31 U/L (ref 0–35)
AST: 25 U/L (ref 0–37)
BILIRUBIN TOTAL: 0.4 mg/dL (ref 0.2–1.2)
Bilirubin, Direct: 0.1 mg/dL (ref 0.0–0.3)
Total Protein: 6.5 g/dL (ref 6.0–8.3)

## 2018-06-06 LAB — BASIC METABOLIC PANEL
BUN: 16 mg/dL (ref 6–23)
CO2: 30 meq/L (ref 19–32)
Calcium: 9.4 mg/dL (ref 8.4–10.5)
Chloride: 109 mEq/L (ref 96–112)
Creatinine, Ser: 0.85 mg/dL (ref 0.40–1.20)
GFR: 87.71 mL/min (ref >60.00)
GLUCOSE: 76 mg/dL (ref 70–99)
POTASSIUM: 4.2 meq/L (ref 3.5–5.1)
SODIUM: 147 meq/L — AB (ref 135–145)

## 2018-06-06 NOTE — Patient Instructions (Addendum)
Please complete lab work prior to leaving. Schedule a routine eye exam. Continue healthy diet and regular exercise.

## 2018-06-06 NOTE — Progress Notes (Signed)
Subjective:    Patient ID: Erica Brandt, female    DOB: December 23, 1995, 22 y.o.   MRN: [id number]  HPI  Patient presents today for complete physical.  Immunizations: tdap 07/13/16 Diet:  Reports healthy diet Exercise:runs/weights/crossfit Pap Smear: 06/05/17 Dental: up to date Vision: due  Wt Readings from Last 3 Encounters:  06/06/18 143 lb 6.4 oz (65 kg)  06/05/17 136 lb 9.6 oz (62 kg)  01/23/17 142 lb 6.4 oz (64.6 kg)     Review of Systems  Constitutional: Negative for unexpected weight change.  HENT: Negative for hearing loss and rhinorrhea.   Eyes: Negative for visual disturbance.  Respiratory: Negative for cough and shortness of breath.   Cardiovascular: Negative for chest pain and leg swelling.  Gastrointestinal: Negative for constipation and diarrhea.  Genitourinary: Negative for dysuria, frequency and hematuria.  Musculoskeletal: Negative for arthralgias and myalgias.  Skin: Negative for rash.  Neurological: Negative for headaches.  Hematological: Negative for adenopathy.  Psychiatric/Behavioral:       Denies depression/anxiety   Past Medical History:  Diagnosis Date  . Seizures ([place])    last one 02/2012 r/t soccer accident- no meds f/u w/ neuro all Normal; most seizures prior to brain surgery     Social History   Socioeconomic History  . Marital status: Single    Spouse name: Not on file  . Number of children: Not on file  . Years of education: Not on file  . Highest education level: Not on file  Occupational History  . Not on file  Social Needs  . Financial resource strain: Not on file  . Food insecurity:    Worry: Not on file    Inability: Not on file  . Transportation needs:    Medical: Not on file    Non-medical: Not on file  Tobacco Use  . Smoking status: Never Smoker  . Smokeless tobacco: Never Used  Substance and Sexual Activity  . Alcohol use: No    Alcohol/week: 0.0 oz  . Drug use: No  . Sexual activity: Never    Birth  control/protection: None    Comment: never  Lifestyle  . Physical activity:    Days per week: Not on file    Minutes per session: Not on file  . Stress: Not on file  Relationships  . Social connections:    Talks on phone: Not on file    Gets together: Not on file    Attends religious service: Not on file    Active member of club or organization: Not on file    Attends meetings of clubs or organizations: Not on file    Relationship status: Not on file  . Intimate partner violence:    Fear of current or ex partner: Not on file    Emotionally abused: Not on file    Physically abused: Not on file    Forced sexual activity: Not on file  Other Topics Concern  . 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 HYMENECTOMY  11/30/2012   Procedure: HYMENECTOMY;  Surgeon: Allyn Kenner, DO;  Location: [facility];  Service: Gynecology;  Laterality: N/A;  Family History  Problem Relation Age of Onset  . Hyperlipidemia Mother        diet controlled  . Arrhythmia Mother   . Cancer Paternal Grandfather        metastatic prostate cancer    No Known Allergies  Current Outpatient Medications on File Prior to Visit  Medication Sig Dispense Refill  . Lysine HCl 500 MG TABS Take 1 tablet by mouth 2 (two) times daily.     No current facility-administered medications on file prior to visit.     BP 112/73 (BP Location: Left Arm, Cuff Size: Normal)   Pulse (!) 57   Temp 98.3 F (36.8 C) (Oral)   Resp 16   Ht 5\' 6"  (1.676 m)   Wt 143 lb 6.4 oz (65 kg)   LMP 06/01/2018   SpO2 100%   BMI 23.15 kg/m       Objective:   Physical Exam  Physical Exam  Constitutional: She is oriented to person, place,  and time. She appears well-developed and well-nourished. No distress.  HENT:  Head: Normocephalic and atraumatic.  Right Ear: Tympanic membrane and ear canal normal.  Left Ear: Tympanic membrane and ear canal normal.  Mouth/Throat: Oropharynx is clear and moist.  Eyes: Pupils are equal, round, and reactive to light. No scleral icterus.  Neck: Normal range of motion. No thyromegaly present.  Cardiovascular: Normal rate and regular rhythm.   No murmur heard. Pulmonary/Chest: Effort normal and breath sounds normal. No respiratory distress. He has no wheezes. She has no rales. She exhibits no tenderness.  Abdominal: Soft. Bowel sounds are normal. She exhibits no distension and no mass. There is no tenderness. There is no rebound and no guarding.  Musculoskeletal: She exhibits no edema.  Lymphadenopathy:    She has no cervical adenopathy.  Neurological: She is alert and oriented to person, place, and time. She has normal patellar reflexes. She exhibits normal muscle tone. Coordination normal.  Skin: Skin is warm and dry.  Psychiatric: She has a normal mood and affect. Her behavior is normal. Judgment and thought content normal.  Breasts: Examined lying Right: Without masses, retractions, discharge or axillary adenopathy.  Left: Without masses, retractions, discharge or axillary adenopathy.  Pelvic: deferred     Assessment & Plan:     Preventative care- encouraged pt to continue healthy diet, exercise. Immunizations/pap up to date. Encouraged her to schedule a routine eye exam.     Assessment & Plan:

## 2018-06-14 ENCOUNTER — Encounter: Payer: Self-pay | Admitting: Family

## 2018-06-14 ENCOUNTER — Other Ambulatory Visit (INDEPENDENT_AMBULATORY_CARE_PROVIDER_SITE_OTHER): Payer: 59

## 2018-06-14 DIAGNOSIS — E870 Hyperosmolality and hypernatremia: Secondary | ICD-10-CM

## 2018-06-14 LAB — BASIC METABOLIC PANEL
BUN: 12 mg/dL (ref 6–23)
CALCIUM: 9.5 mg/dL (ref 8.4–10.5)
CO2: 27 mEq/L (ref 19–32)
CREATININE: 0.85 mg/dL (ref 0.40–1.20)
Chloride: 102 mEq/L (ref 96–112)
GFR: 87.69 mL/min (ref >60.00)
GLUCOSE: 86 mg/dL (ref 70–99)
Potassium: 3.6 mEq/L (ref 3.5–5.1)
Sodium: 138 mEq/L (ref 135–145)

## 2019-05-16 ENCOUNTER — Telehealth: Payer: Self-pay | Admitting: Family

## 2019-05-16 NOTE — Telephone Encounter (Signed)
LVM for pt to call the office and schedule CPE. Pt send message wanting to schedule cpe.

## 2019-06-18 ENCOUNTER — Ambulatory Visit (INDEPENDENT_AMBULATORY_CARE_PROVIDER_SITE_OTHER): Payer: No Typology Code available for payment source | Admitting: Family

## 2019-06-18 ENCOUNTER — Other Ambulatory Visit: Payer: Self-pay

## 2019-06-18 ENCOUNTER — Encounter: Payer: Self-pay | Admitting: Family

## 2019-06-18 VITALS — BP 117/82 | HR 72 | Temp 98.5°F | Resp 16 | Ht 66.5 in | Wt 147.6 lb

## 2019-06-18 DIAGNOSIS — Z0000 Encounter for general adult medical examination without abnormal findings: Secondary | ICD-10-CM

## 2019-06-18 NOTE — Progress Notes (Signed)
Subjective:    Patient ID: Erica Brandt, female    DOB: Jan 14, 1996, 23 y.o.   MRN: [id number]  HPI   Patient is a 22 yr old female who presents today for cpx.  Immunizations:  Up to date Diet:  healthy Exercise: 3-5 days a week- HIIT Pap Smear: 7/18 Dental: up to date Vision:  Does not go to eye dr. Annell Greening Readings from Last 3 Encounters:  06/18/19 147 lb 9.6 oz (67 kg)  06/06/18 143 lb 6.4 oz (65 kg)  06/05/17 136 lb 9.6 oz (62 kg)    Reports that she had gardisil series.      Review of Systems  Constitutional: Negative for unexpected weight change.  HENT: Negative for hearing loss and rhinorrhea.   Eyes: Negative for visual disturbance.  Respiratory: Negative for cough and shortness of breath.   Cardiovascular: Negative for chest pain and leg swelling.  Gastrointestinal: Negative for blood in stool, constipation and diarrhea.  Genitourinary: Negative for dysuria, frequency and hematuria.  Musculoskeletal: Negative for arthralgias and myalgias.  Neurological: Negative for headaches.  Hematological: Negative for adenopathy.  Psychiatric/Behavioral:       Denies depression/anxiety   Past Medical History:  Diagnosis Date  . Seizures ([place])    last one 02/2012 r/t soccer accident- no meds f/u w/ neuro all Normal; most seizures prior to brain surgery     Social History   Socioeconomic History  . Marital status: Single    Spouse name: Not on file  . Number of children: Not on file  . Years of education: Not on file  . Highest education level: Not on file  Occupational History  . Not on file  Social Needs  . Financial resource strain: Not on file  . Food insecurity    Worry: Not on file    Inability: Not on file  . Transportation needs    Medical: Not on file    Non-medical: Not on file  Tobacco Use  . Smoking status: Never Smoker  . Smokeless tobacco: Never Used  Substance and Sexual Activity  . Alcohol use: No    Alcohol/week: 0.0 standard drinks  .  Drug use: No  . Sexual activity: Never    Birth control/protection: None    Comment: never  Lifestyle  . Physical activity    Days per week: Not on file    Minutes per session: Not on file  . Stress: Not on file  Relationships  . Social Herbalist on phone: Not on file    Gets together: Not on file    Attends religious service: Not on file    Active member of club or organization: Not on file    Attends meetings of clubs or organizations: Not on file    Relationship status: Not on file  . Intimate partner violence    Fear of current or ex partner: Not on file    Emotionally abused: Not on file    Physically abused: Not on file    Forced sexual activity: Not on file  Other Topics Concern  . Not on file  Social History Narrative   Lives with mom, dad, brother and sister   Works at [employer] as Therapist, sports   Enjoys reading, piano, works at a summer camp, staying active, spending time with friends/family   Dog     Past Surgical History:  Procedure Laterality Date  . 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 HYMENECTOMY  11/30/2012   Procedure: HYMENECTOMY;  Surgeon: Allyn Kenner, DO;  Location: [facility];  Service: Gynecology;  Laterality: N/A;    Family History  Problem Relation Age of Onset  . Hyperlipidemia Mother        diet controlled  . Arrhythmia Mother   . Cancer Paternal Grandfather        metastatic prostate cancer    No Known Allergies  Current Outpatient Medications on File Prior to Visit  Medication Sig Dispense Refill  . Lysine HCl 500 MG TABS Take 1 tablet by mouth 2 (two) times daily.     No current facility-administered medications on file prior to visit.     BP 117/82 (BP Location: Right Arm, Patient Position: Sitting, Cuff Size: Small)   Pulse 72   Temp 98.5 F (36.9 C) (Oral)   Resp 16   Ht 5' 6.5" (1.689 m)   Wt 147 lb 9.6 oz (67 kg)   LMP 05/31/2019   SpO2 100%   BMI 23.47 kg/m        Objective:   Physical Exam Physical  Exam  Constitutional: She is oriented to person, place, and time. She appears well-developed and well-nourished. No distress.  HENT:  Head: Normocephalic and atraumatic.  Right Ear: Tympanic membrane and ear canal normal.  Left Ear: Tympanic membrane and ear canal normal.  Mouth/Throat: Not examined- pt wearing mask for covid-19 precautions Eyes: Pupils are equal, round, and reactive to light. No scleral icterus.  Neck: Normal range of motion. No thyromegaly present.  Cardiovascular: Normal rate and regular rhythm.   No murmur heard. Pulmonary/Chest: Effort normal and breath sounds normal. No respiratory distress. He has no wheezes. She has no rales. She exhibits no tenderness.  Abdominal: Soft. Bowel sounds are normal. She exhibits no distension and no mass. There is no tenderness. There is no rebound and no guarding.  Musculoskeletal: She exhibits no edema.  Lymphadenopathy:    She has no cervical adenopathy.  Neurological: She is alert and oriented to person, place, and time. She has normal patellar reflexes. She exhibits normal muscle tone. Coordination normal.  Skin: Skin is warm and dry. (small healed follicular cyst noted left inner leg) Psychiatric: She has a normal mood and affect. Her behavior is normal. Judgment and thought content normal.  Breast/pelvic: deferred          Assessment & Plan:    Preventative care- encouraged pt to continue healthy diet and regular exercise. Pap up to date. Suggested that she schedule a routine eye exam.  Labs looked great last year.  Will plan to draw labs next year.  I have asked her to check her shot records to confirm that she had the gardisil series and send [place] a copy.      Assessment & Plan:

## 2019-09-16 ENCOUNTER — Encounter: Payer: Self-pay | Admitting: Family

## 2019-09-19 ENCOUNTER — Encounter: Payer: Self-pay | Admitting: Family Medicine

## 2019-09-19 ENCOUNTER — Ambulatory Visit: Payer: No Typology Code available for payment source | Admitting: Family Medicine

## 2019-09-19 ENCOUNTER — Ambulatory Visit (HOSPITAL_BASED_OUTPATIENT_CLINIC_OR_DEPARTMENT_OTHER)
Admission: RE | Admit: 2019-09-19 | Discharge: 2019-09-19 | Disposition: A | Discharge status: Home / Self Care | Payer: No Typology Code available for payment source | Source: Ambulatory Visit | Attending: Family Medicine | Admitting: Family Medicine

## 2019-09-19 ENCOUNTER — Other Ambulatory Visit: Payer: Self-pay

## 2019-09-19 VITALS — BP 109/60 | HR 58 | Temp 97.8°F | Resp 12 | Ht 66.5 in | Wt 150.0 lb

## 2019-09-19 DIAGNOSIS — S62635A Displaced fracture of distal phalanx of left ring finger, initial encounter for closed fracture: Secondary | ICD-10-CM

## 2019-09-19 DIAGNOSIS — S6992XA Unspecified injury of left wrist, hand and finger(s), initial encounter: Secondary | ICD-10-CM

## 2019-09-19 NOTE — Progress Notes (Addendum)
[facility] [address] [place], [place] [address] [phone]  Date:  09/19/2019   Name:  Erica Brandt   DOB:  December 07, 1995   MRN:  [id number]  PCP:  Debbrah Alar, NP    Chief Complaint: Hand Pain (left ring finger)   History of Present Illness:  Erica Brandt is a 23 y.o. very pleasant female patient who presents with the following:  Generally healthy patient, primary care provider is Debbrah Alar.  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  The day after her injury she did put on a fold over aluminum splint -admits she has taken it on and off . She is an Therapist, sports at [employer]   She also plays the piano and wants to make sure her finger function is as normal as possible  She saw Dr. Amedeo Plenty a few years ago for an elbow issue  Patient Active Problem List   Diagnosis Date Noted  . Preventative health care 04/01/2015    Past Medical History:  Diagnosis Date  . Seizures ([place])    last one 02/2012 r/t soccer accident- no meds f/u w/ neuro all Normal; most seizures prior to brain surgery    Past Surgical History:  Procedure Laterality Date  . 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 HYMENECTOMY  11/30/2012   Procedure: HYMENECTOMY;  Surgeon: Allyn Kenner, DO;  Location: [facility];  Service: Gynecology;  Laterality: N/A;    Social History   Tobacco Use  . Smoking status: Never Smoker  . Smokeless tobacco: Never Used  Substance Use Topics  . Alcohol use: No    Alcohol/week: 0.0 standard drinks  . Drug use: No    Family History  Problem Relation Age of Onset  . Hyperlipidemia Mother        diet  controlled  . Arrhythmia Mother   . Cancer Paternal Grandfather        metastatic prostate cancer    No Known Allergies  Medication list has been reviewed and updated.  Current Outpatient Medications on File Prior to Visit  Medication Sig Dispense Refill  . Lysine HCl 500 MG TABS Take 1 tablet by mouth 2 (two) times daily.     No current facility-administered medications on file prior to visit.     Review of Systems:  As per HPI- otherwise negative. No fever or chills, no chest pain or shortness of breath  Physical Examination: Vitals:   09/19/19 1619  BP: 109/60  Pulse: (!) 58  Resp: 12  Temp: 97.8 F (36.6 C)  SpO2: 100%   Vitals:   09/19/19 1619  Weight: 150 lb (68 kg)  Height: 5' 6.5" (1.689 m)   Body mass index is 23.85 kg/m. Ideal Body Weight: Weight in (lb) to have BMI = 25: 156.9  GEN: WDWN, NAD, Non-toxic, A & O x 3, normal weight, looks well  HEENT: Atraumatic, Normocephalic. Neck supple. No masses, No LAD. Ears and Nose: No external deformity. CV: RRR, No M/G/R. No JVD. No thrill. No extra heart sounds. PULM: CTA B, no wheezes, crackles, rhonchi. No retractions. No  resp. distress. No accessory muscle use. EXTR: No c/c/e NEURO Normal gait.  PSYCH: Normally interactive. Conversant. Not depressed or anxious appearing.  Calm demeanor. Left hand:  Mild tenderness and swelling at the DIP joint  She is wearing an adequate alluminum splint  Finger is NV intact   Assessment and Plan: Closed displaced fracture of distal phalanx of left ring finger, initial encounter - Plan: Ambulatory referral to Hand Surgery  Here today with a fracture of the dorsal aspect of the DIP joint, left ring finger.  This puts her in danger of mallet finger deformity.  She is currently in a splint which keeps the DIP joint in extension.  I have instructed her to keep the splint on at all times, and we are having her see hand surgery tomorrow.  We called and were able to get her an  appt with Dr Angus Palms PA at noon tomorrow  She will let us know if any other problems or concerns  Signed Lamar Blinks, MD

## 2019-09-19 NOTE — Patient Instructions (Addendum)
It was good to see you today- I am sorry that you hurt your finger.  You have a fracture at the DIP joint Keep your splint on all the time- do NOT bend the end of your finger or it may not heal properly We will get you set up with hand for a consultation asap - they can get you in a more comfortable splint as you will need to wear it for several weeks

## 2020-06-06 IMAGING — DX DG HAND COMPLETE 3+V*L*
3 series · 3 of 3 positions shown · non-contrast
Comparison: None.

CLINICAL DATA: Kickball injury. Pain and redness at left ring
finger at the level of the IP joint.

EXAM:
LEFT HAND - COMPLETE 3+ VIEW

[hand pa]
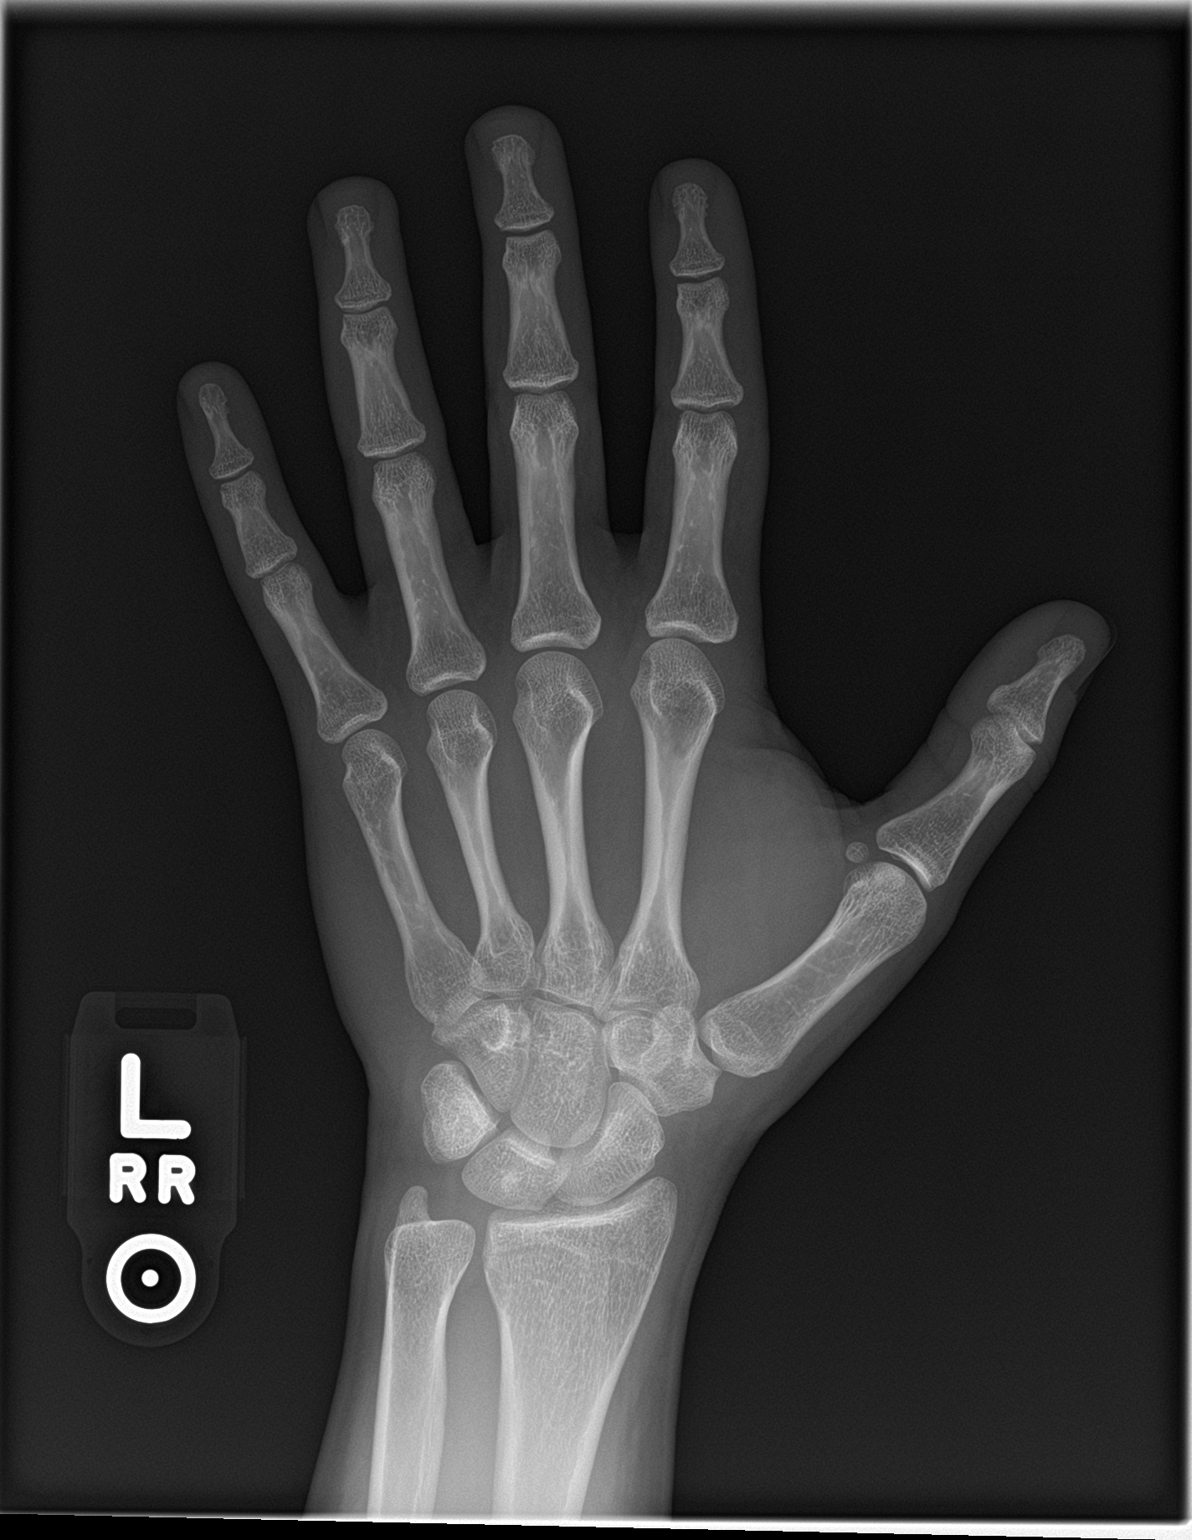

[hand obl]
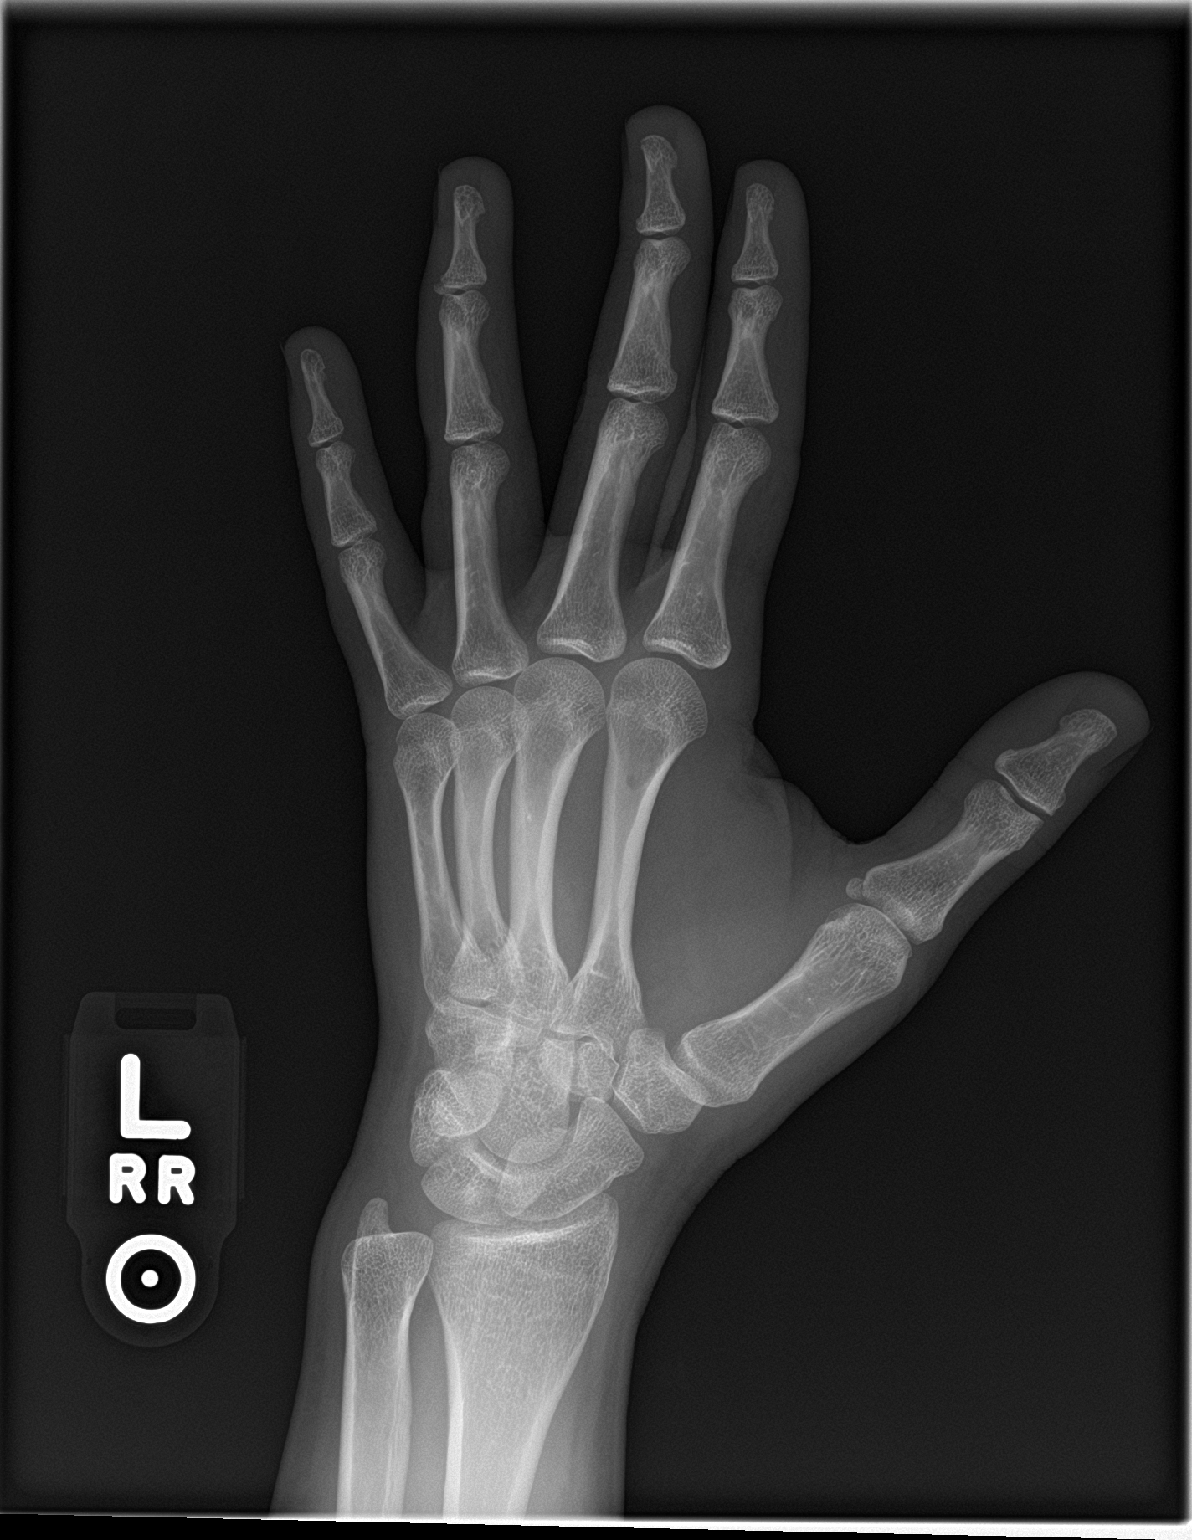

[hand lat]
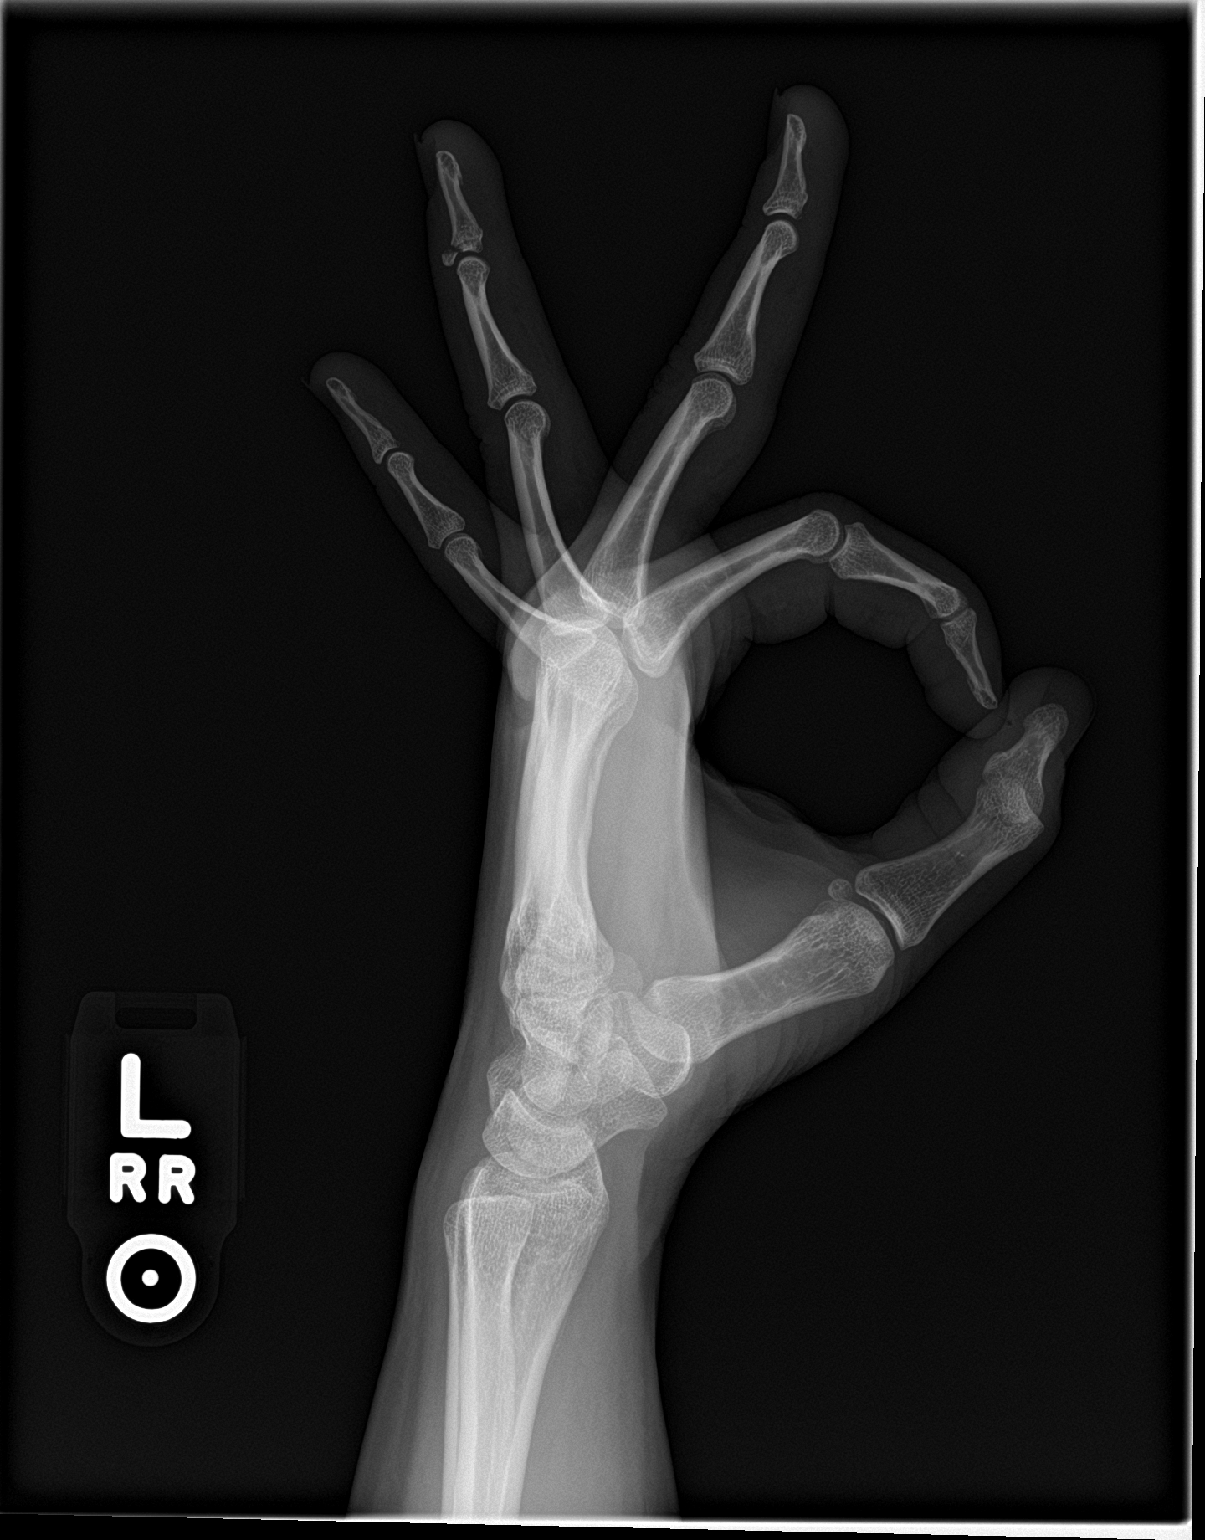

[3 of 3 positions shown; findings below may reference images not displayed]

FINDINGS: There is an acute, intra-articular fracture deformity involving the
dorsal plate of the fourth distal phalanx. Distraction of the
fracture fragments by approximately 1.5 mm noted. Overlying soft
tissue swelling noted.
IMPRESSION: Acute, intra-articular fracture deformity involves the dorsal plate
of the fourth distal phalanx. Overlying soft tissue swelling.

## 2020-06-19 ENCOUNTER — Encounter: Payer: Self-pay | Admitting: Family

## 2020-06-19 ENCOUNTER — Other Ambulatory Visit (HOSPITAL_COMMUNITY)
Admission: RE | Admit: 2020-06-19 | Discharge: 2020-06-19 | Disposition: A | Discharge status: Home / Self Care | Payer: No Typology Code available for payment source | Source: Ambulatory Visit | Attending: Family | Admitting: Family

## 2020-06-19 ENCOUNTER — Ambulatory Visit (INDEPENDENT_AMBULATORY_CARE_PROVIDER_SITE_OTHER): Payer: No Typology Code available for payment source | Admitting: Family

## 2020-06-19 ENCOUNTER — Other Ambulatory Visit: Payer: Self-pay

## 2020-06-19 VITALS — BP 111/70 | HR 59 | Temp 98.6°F | Resp 16 | Ht 66.5 in | Wt 150.4 lb

## 2020-06-19 DIAGNOSIS — Z01419 Encounter for gynecological examination (general) (routine) without abnormal findings: Secondary | ICD-10-CM

## 2020-06-19 DIAGNOSIS — Z9189 Other specified personal risk factors, not elsewhere classified: Secondary | ICD-10-CM

## 2020-06-19 DIAGNOSIS — Z0000 Encounter for general adult medical examination without abnormal findings: Secondary | ICD-10-CM | POA: Diagnosis not present

## 2020-06-19 LAB — HEPATIC FUNCTION PANEL
ALT: 24 U/L (ref 0–35)
AST: 21 U/L (ref 0–37)
Albumin: 4.4 g/dL (ref 3.5–5.2)
Alkaline Phosphatase: 94 U/L (ref 39–117)
Bilirubin, Direct: 0.2 mg/dL (ref 0.0–0.3)
Total Bilirubin: 0.9 mg/dL (ref 0.2–1.2)
Total Protein: 6.9 g/dL (ref 6.0–8.3)

## 2020-06-19 LAB — BASIC METABOLIC PANEL
BUN: 11 mg/dL (ref 6–23)
CO2: 29 mEq/L (ref 19–32)
Calcium: 9.9 mg/dL (ref 8.4–10.5)
Chloride: 103 mEq/L (ref 96–112)
Creatinine, Ser: 0.85 mg/dL (ref 0.40–1.20)
GFR: 81.14 mL/min (ref >60.00)
Glucose, Bld: 84 mg/dL (ref 70–99)
Potassium: 4.6 mEq/L (ref 3.5–5.1)
Sodium: 138 mEq/L (ref 135–145)

## 2020-06-19 LAB — CBC WITH DIFFERENTIAL/PLATELET
Basophils Absolute: 0.1 10*3/uL (ref 0.0–0.1)
Basophils Relative: 0.9 % (ref 0.0–3.0)
Eosinophils Absolute: 0.2 10*3/uL (ref 0.0–0.7)
Eosinophils Relative: 4 % (ref 0.0–5.0)
HCT: 39.8 % (ref 36.0–46.0)
Hemoglobin: 13.6 g/dL (ref 12.0–15.0)
Lymphocytes Relative: 29.3 % (ref 12.0–46.0)
Lymphs Abs: 1.6 10*3/uL (ref 0.7–4.0)
MCHC: 34.1 g/dL (ref 30.0–36.0)
MCV: 91.3 fl (ref 78.0–100.0)
Monocytes Absolute: 0.5 10*3/uL (ref 0.1–1.0)
Monocytes Relative: 9.6 % (ref 3.0–12.0)
Neutro Abs: 3.1 10*3/uL (ref 1.4–7.7)
Neutrophils Relative %: 56.2 % (ref 43.0–77.0)
Platelets: 285 10*3/uL (ref 150.0–400.0)
RBC: 4.36 Mil/uL (ref 3.87–5.11)
RDW: 12.3 % (ref 11.5–15.5)
WBC: 5.6 10*3/uL (ref 4.0–10.5)

## 2020-06-19 LAB — SARS-COV-2 IGG: SARS-COV-2 IgG: 0.03

## 2020-06-19 LAB — LIPID PANEL
Cholesterol: 131 mg/dL (ref 0–200)
HDL: 40.8 mg/dL (ref >39.00)
LDL Cholesterol: 79 mg/dL (ref 0–99)
NonHDL: 90.47
Total CHOL/HDL Ratio: 3
Triglycerides: 55 mg/dL (ref 0.0–149.0)
VLDL: 11 mg/dL (ref 0.0–40.0)

## 2020-06-19 LAB — TSH: TSH: 1.33 u[IU]/mL (ref 0.35–4.50)

## 2020-06-19 NOTE — Patient Instructions (Addendum)
Continue your work on healthy diet and regular exercise.   Preventive Care 5-25 Years Old, Female Preventive care refers to lifestyle choices and visits with your health care provider that can promote health and wellness. At this stage in your life, you may start seeing a primary care physician instead of a pediatrician. Your health care is now your responsibility. Preventive care for young adults includes:  A yearly physical exam. This is also called an annual wellness visit.  Regular dental and eye exams.  Immunizations.  Screening for certain conditions.  Healthy lifestyle choices, such as diet and exercise. What can I expect for my preventive care visit? Physical exam Your health care provider may check:  Height and weight. These may be used to calculate body mass index (BMI), which is a measurement that tells if you are at a healthy weight.  Heart rate and blood pressure.  Body temperature. Counseling Your health care provider may ask you questions about:  Past medical problems and family medical history.  Alcohol, tobacco, and drug use.  Home and relationship well-being.  Access to firearms.  Emotional well-being.  Diet, exercise, and sleep habits.  Sexual activity and sexual health.  Method of birth control.  Menstrual cycle.  Pregnancy history. What immunizations do I need?  Influenza (flu) vaccine  This is recommended every year. Tetanus, diphtheria, and pertussis (Tdap) vaccine  You may need a Td booster every 10 years. Varicella (chickenpox) vaccine  You may need this vaccine if you have not already been vaccinated. Human papillomavirus (HPV) vaccine  If recommended by your health care provider, you may need three doses over 6 months. Measles, mumps, and rubella (MMR) vaccine  You may need at least one dose of MMR. You may also need a second dose. Meningococcal conjugate (MenACWY) vaccine  One dose is recommended if you are 57-57 years old  and a Market researcher living in a residence hall, or if you have one of several medical conditions. You may also need additional booster doses. Pneumococcal conjugate (PCV13) vaccine  You may need this if you have certain conditions and were not previously vaccinated. Pneumococcal polysaccharide (PPSV23) vaccine  You may need one or two doses if you smoke cigarettes or if you have certain conditions. Hepatitis A vaccine  You may need this if you have certain conditions or if you travel or work in places where you may be exposed to hepatitis A. Hepatitis B vaccine  You may need this if you have certain conditions or if you travel or work in places where you may be exposed to hepatitis B. Haemophilus influenzae type b (Hib) vaccine  You may need this if you have certain risk factors. You may receive vaccines as individual doses or as more than one vaccine together in one shot (combination vaccines). Talk with your health care provider about the risks and benefits of combination vaccines. What tests do I need? Blood tests  Lipid and cholesterol levels. These may be checked every 5 years starting at age 18.  Hepatitis C test.  Hepatitis B test. Screening  Pelvic exam and Pap test. This may be done every 3 years starting at age 73.  Sexually transmitted disease (STD) testing, if you are at risk.  BRCA-related cancer screening. This may be done if you have a family history of breast, ovarian, tubal, or peritoneal cancers. Other tests  Tuberculosis skin test.  Vision and hearing tests.  Skin exam.  Breast exam. Follow these instructions at home: Eating and  drinking   Eat a diet that includes fresh fruits and vegetables, whole grains, lean protein, and low-fat dairy products.  Drink enough fluid to keep your urine pale yellow.  Do not drink alcohol if: ? Your health care provider tells you not to drink. ? You are pregnant, may be pregnant, or are planning to  become pregnant. ? You are under the legal drinking age. In the [place] the legal drinking age is 42.  If you drink alcohol: ? Limit how much you have to 0-1 drink a day. ? Be aware of how much alcohol is in your drink. In the [place] one drink equals one 12 oz bottle of beer (355 mL), one 5 oz glass of wine (148 mL), or one 1 oz glass of hard liquor (44 mL). Lifestyle  Take daily care of your teeth and gums.  Stay active. Exercise at least 30 minutes 5 or more days of the week.  Do not use any products that contain nicotine or tobacco, such as cigarettes, e-cigarettes, and chewing tobacco. If you need help quitting, ask your health care provider.  Do not use drugs.  If you are sexually active, practice safe sex. Use a condom or other form of birth control (contraception) in order to prevent pregnancy and STIs (sexually transmitted infections). If you plan to become pregnant, see your health care provider for a pre-conception visit.  Find healthy ways to cope with stress, such as: ? Meditation, yoga, or listening to music. ? Journaling. ? Talking to a trusted person. ? Spending time with friends and family. Safety  Always wear your seat belt while driving or riding in a vehicle.  Do not drive if you have been drinking alcohol. Do not ride with someone who has been drinking.  Do not drive when you are tired or distracted. Do not text while driving.  Wear a helmet and other protective equipment during sports activities.  If you have firearms in your house, make sure you follow all gun safety procedures.  Seek help if you have been bullied, physically abused, or sexually abused.  Use the Internet responsibly to avoid dangers such as online bullying and online sex predators. What's next?  Go to your health care provider once a year for a well check visit.  Ask your health care provider how often you should have your eyes and teeth checked.  Stay up to date on all vaccines. This  information is not intended to replace advice given to you by your health care provider. Make sure you discuss any questions you have with your health care provider. Document Revised: 11/08/2018 Document Reviewed: 11/08/2018 Elsevier Patient Education  2020 [employer].

## 2020-06-19 NOTE — Addendum Note (Signed)
Addended by: Jiles Prows on: 06/19/2020 12:15 PM   Modules accepted: Orders

## 2020-06-19 NOTE — Progress Notes (Signed)
Subjective:    Patient ID: Erica Brandt, female    DOB: February 02, 1996, 24 y.o.   MRN: [id number]  HPI  Patient presents today for complete physical.  Immunizations:  tdap 2017  Diet: healthy Exercise:  Strength training Pap Smear: 06/05/17 Wt Readings from Last 3 Encounters:  06/19/20 150 lb 6.4 oz (68.2 kg)  09/19/19 150 lb (68 kg)  06/18/19 147 lb 9.6 oz (67 kg)  Vision: due Dental: up to date   Review of Systems  Constitutional: Negative for unexpected weight change.  HENT: Negative for hearing loss and rhinorrhea.   Eyes: Negative for visual disturbance.  Respiratory: Negative for cough and shortness of breath.   Cardiovascular: Negative for chest pain.  Gastrointestinal: Negative for blood in stool, constipation, diarrhea and nausea.  Genitourinary: Negative for dysuria, frequency, hematuria and menstrual problem (last 2 cycles have been 31, 36 days, 37 days).  Musculoskeletal: Negative for arthralgias and myalgias.  Skin: Negative for rash.  Neurological: Negative for headaches.  Hematological: Negative for adenopathy.  Psychiatric/Behavioral:       Denies depression/anxiety   Past Medical History:  Diagnosis Date  . Seizures ([place])    last one 02/2012 r/t soccer accident- no meds f/u w/ neuro all Normal; most seizures prior to brain surgery     Social History   Socioeconomic History  . Marital status: Single    Spouse name: Not on file  . Number of children: Not on file  . Years of education: Not on file  . Highest education level: Not on file  Occupational History  . Not on file  Tobacco Use  . Smoking status: Never Smoker  . Smokeless tobacco: Never Used  Substance and Sexual Activity  . Alcohol use: No    Alcohol/week: 0.0 standard drinks  . Drug use: No  . Sexual activity: Never    Birth control/protection: None    Comment: never  Other Topics Concern  . Not on file  Social History Narrative   Lives with mom, dad, brother and sister   Works at  [employer] as Therapist, sports   Enjoys reading, piano, works at a summer camp, staying active, spending time with friends/family   Dog    Social Determinants of Radio broadcast assistant Strain:   . Difficulty of Paying Living Expenses:   Food Insecurity:   . Worried About Charity fundraiser in the Last Year:   . Arboriculturist in the Last Year:   Transportation Needs:   . Film/video editor (Medical):   Marland Kitchen Lack of Transportation (Non-Medical):   Physical Activity:   . Days of Exercise per Week:   . Minutes of Exercise per Session:   Stress:   . Feeling of Stress :   Social Connections:   . Frequency of Communication with Friends and Family:   . Frequency of Social Gatherings with Friends and Family:   . Attends Religious Services:   . Active Member of Clubs or Organizations:   . Attends Archivist Meetings:   Marland Kitchen Marital Status:   Intimate Partner Violence:   . Fear of Current or Ex-Partner:   . Emotionally Abused:   Marland Kitchen Physically Abused:   . Sexually Abused:     Past Surgical History:  Procedure Laterality Date  . 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 HYMENECTOMY  11/30/2012   Procedure: HYMENECTOMY;  Surgeon: Allyn Kenner, DO;  Location: [facility];  Service: Gynecology;  Laterality:  N/A;    Family History  Problem Relation Age of Onset  . Hyperlipidemia Mother        diet controlled  . Arrhythmia Mother   . Cancer Paternal Grandfather        metastatic prostate cancer    No Known Allergies  Current Outpatient Medications on File Prior to Visit  Medication Sig Dispense Refill  . Lysine HCl 500 MG TABS Take 1 tablet by mouth 2 (two) times daily.     No current facility-administered medications on file prior to visit.    BP 111/70 (BP Location: Right Arm, Patient Position: Sitting, Cuff Size: Small)   Pulse 59   Temp 98.6 F (37 C) (Oral)   Resp 16   Ht 5' 6.5" (1.689 m)   Wt 150 lb 6.4 oz (68.2 kg)   SpO2 100%   BMI 23.91 kg/m         Objective:   Physical Exam Physical Exam  Constitutional: She is oriented to person, place, and time. She appears well-developed and well-nourished. No distress.  HENT:  Head: Normocephalic and atraumatic.  Right Ear: Tympanic membrane and ear canal normal.  Left Ear: Tympanic membrane and ear canal normal.  Mouth/Throat: Not examined- pt wearing mask Eyes: Pupils are equal, round, and reactive to light. No scleral icterus.  Neck: Normal range of motion. No thyromegaly present.  Cardiovascular: Normal rate and regular rhythm.   No murmur heard. Pulmonary/Chest: Effort normal and breath sounds normal. No respiratory distress. He has no wheezes. She has no rales. She exhibits no tenderness.  Abdominal: Soft. Bowel sounds are normal. She exhibits no distension and no mass. There is no tenderness. There is no rebound and no guarding.  Musculoskeletal: She exhibits no edema.  Lymphadenopathy:    She has no cervical adenopathy.  Neurological: She is alert and oriented to person, place, and time. She has normal patellar reflexes. She exhibits normal muscle tone. Coordination normal.  Skin: Skin is warm and dry.  Psychiatric: She has a normal mood and affect. Her behavior is normal. Judgment and thought content normal.  Breasts: Examined lying Right: Without masses, retractions, discharge or axillary adenopathy.  Left: Without masses, retractions, discharge or axillary adenopathy.  Inguinal/mons: Normal without inguinal adenopathy  External genitalia: Normal  BUS/Urethra/Skene's glands: Normal  Bladder: Normal  Vagina: Normal (some blood noted in vagina) Cervix: Normal  Uterus: normal in size, shape and contour. Midline and mobile  Adnexa/parametria:  Rt: Without masses or tenderness.  Lt: Without masses or tenderness.  Anus and perineum: Normal            Assessment & Plan:   Preventative care- encouraged pt to continue healthy diet, exercise.  She plans to get covid vaccine  from her employer. Pap performed today.  Obtain routine lab work. Discussed birth control, she is not interested at this time. She is requesting COVID IgG testing. Obtain routine lab work.  This visit occurred during the SARS-CoV-2 public health emergency.  Safety protocols were in place, including screening questions prior to the visit, additional usage of staff PPE, and extensive cleaning of exam room while observing appropriate contact time as indicated for disinfecting solutions.            Assessment & Plan:

## 2020-06-22 LAB — CYTOLOGY - PAP: Diagnosis: NEGATIVE

## 2020-09-11 ENCOUNTER — Other Ambulatory Visit: Payer: No Typology Code available for payment source

## 2020-09-11 DIAGNOSIS — Z20822 Contact with and (suspected) exposure to covid-19: Secondary | ICD-10-CM

## 2020-09-13 LAB — NOVEL CORONAVIRUS, NAA: SARS-CoV-2, NAA: NOT DETECTED

## 2020-09-13 LAB — SARS-COV-2, NAA 2 DAY TAT

## 2021-08-09 DIAGNOSIS — M79671 Pain in right foot: Secondary | ICD-10-CM | POA: Diagnosis not present

## 2022-04-30 NOTE — Progress Notes (Addendum)
[facility] [address] [place], [place] [address] [phone]  Date:  05/02/2022   Name:  Erica Brandt   DOB:  1996-06-30   MRN:  [id number]  PCP:  Debbrah Alar, NP    Chief Complaint: Annual Exam (Concerns/ questions: none/Hep C/ HIV screen due)   History of Present Illness:  Erica Brandt is a 27 y.o. very pleasant female patient who presents with the following:  Generally healthy young woman here today for physical exam Patient of Debbrah Alar, I did see her in 2020 with a broken finger  Pap 2021-negative, HPV not done- she does not wish to do pap today, can do next year Can update blood work today ?  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 Single, she lives in [place] She enjoys piano, exercise   She had brain surgery as a teen for a benign brain tumor- she follows up with [facility] periodically for this and does periodic MRI screening  Has all been ok since her surgery   Pt reports she has been screened for HIV and hep C in the past due to needlestick   Patient Active Problem List   Diagnosis Date Noted   Preventative health care 04/01/2015    Past Medical History:  Diagnosis Date   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   Seizures ([place])    last one 02/2012 r/t soccer accident- no meds f/u w/ neuro all Normal; most seizures prior to brain surgery    Past Surgical History:  Procedure Laterality Date   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  No current outpatient medications on file prior to visit.   No current facility-administered medications on file prior to visit.    Review of Systems:  As per HPI- otherwise negative.   Physical Examination: Vitals:   05/02/22 1020  BP: 110/64  Pulse: 73  Resp: 18  Temp: 98 F (36.7 C)  SpO2: 98%   Vitals:   05/02/22 1020  Weight: 153 lb (69.4 kg)  Height: 5' 6.5" (1.689 m)   Body mass index is 24.32 kg/m. Ideal Body Weight: Weight in (lb) to have BMI = 25: 156.9  GEN: no acute distress. Normal weight, looks well  HEENT: Atraumatic, Normocephalic.  Bilateral TM wnl, oropharynx normal.  PEERL,EOMI.   Ears and Nose: No external deformity. CV: RRR, No M/G/R. No JVD. No thrill. No extra heart sounds. PULM: CTA B, no wheezes, crackles, rhonchi. No retractions. No resp. distress. No accessory muscle use. ABD: S, NT, ND, +BS. No rebound. No HSM. EXTR: No c/c/e PSYCH: Normally interactive. Conversant.  Assessment and Plan: Physical exam  Screening for deficiency anemia - Plan: CBC  Screening for diabetes mellitus - Plan: Comprehensive metabolic panel, Hemoglobin A1c  Screening for hyperlipidemia - Plan: Lipid panel  Screening for thyroid disorder - Plan: TSH  Fatigue, unspecified type - Plan: VITAMIN D 25 Hydroxy (Vit-D Deficiency, Fractures)  Physical exam today.  Encouraged healthy diet and exercise routine Encouraged periodic derm skin check as she has a fair complexion Will plan further follow- up pending labs.  Signed Lamar Blinks, MD  Received pt labs as below, sent message   Results for orders placed or performed in visit on 05/02/22  CBC  Result Value Ref Range   WBC 5.8 4.0 - 10.5 K/uL   RBC 4.30 3.87 - 5.11  Mil/uL   Platelets 270.0 150.0 - 400.0 K/uL   Hemoglobin 13.2 12.0 - 15.0 g/dL   HCT 38.4 36.0 - 46.0 %   MCV 89.3 78.0 - 100.0 fl   MCHC 34.4 30.0 - 36.0 g/dL   RDW 12.6 11.5 - 15.5 %  Comprehensive metabolic panel  Result Value Ref Range   Sodium 136 135 - 145 mEq/L   Potassium 3.9 3.5 - 5.1 mEq/L   Chloride 102 96 - 112 mEq/L   CO2 26 19 - 32 mEq/L   Glucose, Bld 78 70 - 99 mg/dL   BUN 14 6 - 23 mg/dL   Creatinine, Ser 0.87 0.40 - 1.20 mg/dL   Total Bilirubin 1.0 0.2 - 1.2 mg/dL   Alkaline Phosphatase 89 39 - 117 U/L   AST 24 0 - 37 U/L   ALT 31 0 - 35 U/L   Total Protein 6.9 6.0 - 8.3 g/dL   Albumin 4.3 3.5 - 5.2 g/dL   GFR 91.32 >60.00 mL/min   Calcium 9.7 8.4 - 10.5 mg/dL  Hemoglobin A1c  Result Value Ref Range   Hgb A1c MFr Bld 5.2 4.6 - 6.5 %  Lipid panel  Result Value Ref Range   Cholesterol 130 0 - 200 mg/dL   Triglycerides 47.0 0.0 - 149.0 mg/dL   HDL 45.60 >39.00 mg/dL   VLDL 9.4 0.0 - 40.0 mg/dL   LDL Cholesterol 75 0 - 99 mg/dL   Total CHOL/HDL Ratio 3    NonHDL 84.75   TSH  Result Value Ref Range   TSH 1.59 0.35 - 5.50 uIU/mL  VITAMIN D 25 Hydroxy (Vit-D Deficiency, Fractures)  Result Value Ref Range   VITD 53.95 30.00 - 100.00 ng/mL

## 2022-04-30 NOTE — Patient Instructions (Addendum)
It was good to see you again today, I will be in touch with your labs Keep up the good work!

## 2022-05-02 ENCOUNTER — Encounter: Payer: Self-pay | Admitting: Family Medicine

## 2022-05-02 ENCOUNTER — Ambulatory Visit (INDEPENDENT_AMBULATORY_CARE_PROVIDER_SITE_OTHER): Payer: Commercial Managed Care - PPO | Admitting: Family Medicine

## 2022-05-02 VITALS — BP 110/64 | HR 73 | Temp 98.0°F | Resp 18 | Ht 66.5 in | Wt 153.0 lb

## 2022-05-02 DIAGNOSIS — Z1329 Encounter for screening for other suspected endocrine disorder: Secondary | ICD-10-CM | POA: Diagnosis not present

## 2022-05-02 DIAGNOSIS — Z0000 Encounter for general adult medical examination without abnormal findings: Secondary | ICD-10-CM

## 2022-05-02 DIAGNOSIS — Z13220 Encounter for screening for lipoid disorders: Secondary | ICD-10-CM | POA: Diagnosis not present

## 2022-05-02 DIAGNOSIS — R5383 Other fatigue: Secondary | ICD-10-CM

## 2022-05-02 DIAGNOSIS — Z131 Encounter for screening for diabetes mellitus: Secondary | ICD-10-CM

## 2022-05-02 DIAGNOSIS — Z130 Encounter for screening for diseases of the blood and blood-forming organs and certain disorders involving the immune mechanism: Secondary | ICD-10-CM

## 2022-05-02 LAB — CBC
HCT: 38.4 % (ref 36.0–46.0)
Hemoglobin: 13.2 g/dL (ref 12.0–15.0)
MCHC: 34.4 g/dL (ref 30.0–36.0)
MCV: 89.3 fl (ref 78.0–100.0)
Platelets: 270 10*3/uL (ref 150.0–400.0)
RBC: 4.3 Mil/uL (ref 3.87–5.11)
RDW: 12.6 % (ref 11.5–15.5)
WBC: 5.8 10*3/uL (ref 4.0–10.5)

## 2022-05-02 LAB — COMPREHENSIVE METABOLIC PANEL
ALT: 31 U/L (ref 0–35)
AST: 24 U/L (ref 0–37)
Albumin: 4.3 g/dL (ref 3.5–5.2)
Alkaline Phosphatase: 89 U/L (ref 39–117)
BUN: 14 mg/dL (ref 6–23)
CO2: 26 mEq/L (ref 19–32)
Calcium: 9.7 mg/dL (ref 8.4–10.5)
Chloride: 102 mEq/L (ref 96–112)
Creatinine, Ser: 0.87 mg/dL (ref 0.40–1.20)
GFR: 91.32 mL/min (ref >60.00)
Glucose, Bld: 78 mg/dL (ref 70–99)
Potassium: 3.9 mEq/L (ref 3.5–5.1)
Sodium: 136 mEq/L (ref 135–145)
Total Bilirubin: 1 mg/dL (ref 0.2–1.2)
Total Protein: 6.9 g/dL (ref 6.0–8.3)

## 2022-05-02 LAB — VITAMIN D 25 HYDROXY (VIT D DEFICIENCY, FRACTURES): VITD: 53.95 ng/mL (ref 30.00–100.00)

## 2022-05-02 LAB — TSH: TSH: 1.59 u[IU]/mL (ref 0.35–5.50)

## 2022-05-02 LAB — LIPID PANEL
Cholesterol: 130 mg/dL (ref 0–200)
HDL: 45.6 mg/dL (ref >39.00)
LDL Cholesterol: 75 mg/dL (ref 0–99)
NonHDL: 84.75
Total CHOL/HDL Ratio: 3
Triglycerides: 47 mg/dL (ref 0.0–149.0)
VLDL: 9.4 mg/dL (ref 0.0–40.0)

## 2022-05-02 LAB — HEMOGLOBIN A1C: Hgb A1c MFr Bld: 5.2 % (ref 4.6–6.5)

## 2022-11-25 DIAGNOSIS — G939 Disorder of brain, unspecified: Secondary | ICD-10-CM | POA: Diagnosis not present

## 2022-11-25 DIAGNOSIS — Z09 Encounter for follow-up examination after completed treatment for conditions other than malignant neoplasm: Secondary | ICD-10-CM | POA: Diagnosis not present

## 2022-11-25 DIAGNOSIS — G9389 Other specified disorders of brain: Secondary | ICD-10-CM | POA: Diagnosis not present

## 2022-11-25 DIAGNOSIS — Z8603 Personal history of neoplasm of uncertain behavior: Secondary | ICD-10-CM | POA: Diagnosis not present

## 2023-06-07 ENCOUNTER — Telehealth: Payer: 59 | Admitting: Family Medicine

## 2023-06-07 DIAGNOSIS — M7989 Other specified soft tissue disorders: Secondary | ICD-10-CM | POA: Diagnosis not present

## 2023-06-07 DIAGNOSIS — T63451A Toxic effect of venom of hornets, accidental (unintentional), initial encounter: Secondary | ICD-10-CM | POA: Diagnosis not present

## 2023-06-07 MED ORDER — PREDNISONE 20 MG PO TABS: 40.0000 mg | ORAL_TABLET | Freq: Every day | ORAL | 0 refills | Status: AC

## 2023-06-07 NOTE — Progress Notes (Signed)
E-Visit for Insect Sting  Thank you for describing the insect sting for [place].  Here is how we plan to help!  A sting that we will treat with a short course of prednisone.  The 2 greatest risks from insect stings are allergic reaction, which can be fatal in some people and infection, which is more common and less serious.  Bees, wasps, yellow jackets, and hornets belong to a class of insects called Hymenoptera.  Most insect stings cause only minor discomfort.  Stings can happen anywhere on the body and can be painful.  Most stings are from honey bees or yellow jackets.  Fire ants can sting multiple times.  The sites of the stings are more likely to become infected.    Based on your information I have:, Provided a home care guide for insect stings and instructions on when to call for help., and I have sent in prednisone 40 mg by mouth daily for 5 days to the pharmacy you selected.  Please make sure that you selected a pharmacy that is open now.  What can be used to prevent Insect Stings?  Insect repellant with at least 20% DEET.  Wearing long pants and shirts with socks and shoes.  Wear dark or drab-colored clothes rather than bright colors.  Avoid using perfumes and hair sprays; these attract insects.  HOME CARE ADVICE:  1. Stinger removal: The stinger looks like a tiny black dot in the sting. Use a fingernail, credit card edge, or knife-edge to scrape it off.  Don't pull it out because it squeezes out more venom. If the stinger is below the skin surface, leave it alone.  It will be shed with normal skin healing. 2. Use cold compresses to the area of the sting for 10-20 minutes.  You may repeat this as needed to relieve symptoms of pain and swelling. 3.  For pain relief, take acetominophen 650 mg 4-6 hours as needed or ibuprofen 400 mg every 6-8 hours as needed or naproxen 250-500 mg every 12 hours as needed. 4.  You can also use hydrocortisone cream 0.5% or 1% up to 4 times daily as  needed for itching. 5.  If the sting becomes very itchy, take Benadryl 25-50 mg, follow directions on box. 6.  Wash the area 2-3 times daily with antibacterial soap and warm water. 7. Call your Doctor if: Fever, a severe headache, or rash occur in the next 2 weeks. Sting area begins to look infected. Redness and swelling worsens after home treatment. Your current symptoms become worse.    MAKE SURE YOU:  Understand these instructions. Will watch your condition. Will get help right away if you are not doing well or get worse.  Thank you for choosing an e-visit.  Your e-visit answers were reviewed by a board certified advanced clinical practitioner to complete your personal care plan. Depending upon the condition, your plan could have included both over the counter or prescription medications.  Please review your pharmacy choice. Make sure the pharmacy is open so you can pick up prescription now. If there is a problem, you may contact your provider through [employer] and have the prescription routed to another pharmacy.  Your safety is important to [place]. If you have drug allergies check your prescription carefully.   For the next 24 hours you can use MyChart to ask questions about today's visit, request a non-urgent call back, or ask for a work or school excuse. You will get an email in the next  two days asking about your experience. I hope that your e-visit has been valuable and will speed your recovery.  I provided 5 minutes of non face-to-face time during this encounter for chart review, medication and order placement, as well as and documentation.

## 2023-07-20 ENCOUNTER — Encounter: Payer: Self-pay | Admitting: Family

## 2023-07-20 MED ORDER — TYPHOID VACCINE PO CPDR: DELAYED_RELEASE_CAPSULE | ORAL | 0 refills | Status: AC

## 2023-07-24 ENCOUNTER — Encounter: Payer: Self-pay | Admitting: Family

## 2023-07-24 ENCOUNTER — Ambulatory Visit: Payer: 59 | Admitting: Family

## 2023-07-24 VITALS — BP 114/76 | HR 63 | Temp 98.1°F | Resp 16 | Ht 66.5 in | Wt 157.0 lb

## 2023-07-24 DIAGNOSIS — Z0000 Encounter for general adult medical examination without abnormal findings: Secondary | ICD-10-CM | POA: Diagnosis not present

## 2023-07-24 NOTE — Assessment & Plan Note (Addendum)
Continue healthy diet and regular exercise. Refer to GYN for Pap. Tetanus up to date. She will be travelling to [place] and will take the vivotif vaccine prior to departure.  Hep A/B/Tetanus/MMR/polio all up to date. She was told by her travel group that malaria prophylaxis is not necessary for the area they will be traveling to.   Recommended covid/flu vaccine. She declines covid vaccination.

## 2023-07-24 NOTE — Progress Notes (Signed)
Subjective:     Patient ID: Erica Brandt, female    DOB: 01/27/1996, 27 y.o.   MRN: [id number]  No chief complaint on file.   HPI  Discussed the use of AI scribe software for clinical note transcription with the patient, who gave verbal consent to proceed.  History of Present Illness         Patient presents today for complete physical.  Immunizations: Diet: healthy Exercise:  good, walks, resistance training Pap Smear: 2021 Vision: due Dental: up to date       Health Maintenance Due  Topic Date Due   COVID-19 Vaccine (1 - 2023-24 season) Never done   PAP-Cervical Cytology Screening  06/20/2023   PAP SMEAR-Modifier  06/20/2023   INFLUENZA VACCINE  06/29/2023    Past Medical History:  Diagnosis Date   Ganglioglioma of brain (HCC) 2012   Seizures (HCC)    last one 02/2012 r/t soccer accident- no meds f/u w/ neuro all Normal; most seizures prior to brain surgery    Past Surgical History:  Procedure Laterality Date   BRAIN TUMOR EXCISION  06/27/11   ganglioglioma   HYMENECTOMY  11/30/2012   Procedure: HYMENECTOMY;  Surgeon: Philip Aspen, DO;  Location: [facility];  Service: Gynecology;  Laterality: N/A;    Family History  Problem Relation Age of Onset   Hyperlipidemia Mother        diet controlled   Arrhythmia Mother    Colon polyps Mother    Stroke Maternal Grandmother    Hypertension Maternal Grandmother    Atrial fibrillation Maternal Grandmother    Hypertension Maternal Grandfather    Pulmonary embolism Paternal Grandmother        occured post op   Cancer Paternal Grandfather        metastatic prostate cancer    Social History   Socioeconomic History   Marital status: Single    Spouse name: Not on file   Number of children: Not on file   Years of education: Not on file   Highest education level: Bachelor's degree (e.g., BA, AB, BS)  Occupational History   Not on file  Tobacco Use   Smoking status: Never   Smokeless tobacco: Never   Substance and Sexual Activity   Alcohol use: No    Alcohol/week: 0.0 standard drinks of alcohol   Drug use: No   Sexual activity: Never    Birth control/protection: None    Comment: never  Other Topics Concern   Not on file  Social History Narrative   Lives with mom, dad, brother and sister   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   Enjoys reading, Public librarian, works at a summer camp, staying active, spending time with friends/family   Dog    Social Determinants of Health   Financial Resource Strain: Low Risk  (07/23/2023)   Overall Financial Resource Strain (CARDIA)    Difficulty of Paying Living Expenses: Not hard at all  Food Insecurity: No Food Insecurity (07/23/2023)   Hunger Vital Sign    Worried About Running Out of Food in the Last Year: Never true    Ran Out of Food in the Last Year: Never true  Transportation Needs: No Transportation Needs (07/23/2023)   PRAPARE - Administrator, Civil Service (Medical): No    Lack of Transportation (Non-Medical): No  Physical Activity: Sufficiently Active (07/23/2023)   Exercise Vital Sign    Days of Exercise per Week: 4 days    Minutes of Exercise  per Session: 40 min  Stress: No Stress Concern Present (07/23/2023)   [employer] of Occupational Health - Occupational Stress Questionnaire    Feeling of Stress : Not at all  Social Connections: Moderately Integrated (07/23/2023)   Social Connection and Isolation Panel [NHANES]    Frequency of Communication with Friends and Family: More than three times a week    Frequency of Social Gatherings with Friends and Family: More than three times a week    Attends Religious Services: More than 4 times per year    Active Member of [employer] or Organizations: Yes    Attends Engineer, structural: More than 4 times per year    Marital Status: Never married  Catering manager Violence: Not on file    Outpatient Medications Prior to Visit  Medication Sig Dispense Refill   typhoid (VIVOTIF) DR  capsule One capsule on alternate days (day 1, 3, 5, and 7) for a total of 4 doses; all doses should be complete at least 1 week prior to potential exposure. 4 capsule 0   No facility-administered medications prior to visit.    No Known Allergies  Review of Systems  Constitutional:  Negative for weight loss.  HENT:  Negative for congestion and hearing loss.   Eyes:  Negative for blurred vision.  Respiratory:  Negative for cough.   Cardiovascular:  Negative for leg swelling.  Gastrointestinal:  Negative for constipation and diarrhea.  Genitourinary:  Negative for dysuria and frequency.  Musculoskeletal:  Negative for joint pain and myalgias.  Skin:  Negative for rash.  Neurological:  Negative for headaches.  Psychiatric/Behavioral:         Denies depression/anxiety       Objective:    Physical Exam   BP 114/76 (BP Location: Right Arm, Patient Position: Sitting, Cuff Size: Small)   Pulse 63   Temp 98.1 F (36.7 C) (Oral)   Resp 16   Ht 5' 6.5" (1.689 m)   Wt 157 lb (71.2 kg)   SpO2 100%   BMI 24.96 kg/m  Wt Readings from Last 3 Encounters:  07/24/23 157 lb (71.2 kg)  05/02/22 153 lb (69.4 kg)  06/19/20 150 lb 6.4 oz (68.2 kg)  Physical Exam  Constitutional: She is oriented to person, place, and time. She appears well-developed and well-nourished. No distress.  HENT:  Head: Normocephalic and atraumatic.  Right Ear: Tympanic membrane and ear canal normal.  Left Ear: Tympanic membrane and ear canal normal.  Mouth/Throat: Oropharynx is clear and moist.  Eyes: Pupils are equal, round, and reactive to light. No scleral icterus.  Neck: Normal range of motion. No thyromegaly present.  Cardiovascular: Normal rate and regular rhythm.   No murmur heard. Pulmonary/Chest: Effort normal and breath sounds normal. No respiratory distress. He has no wheezes. She has no rales. She exhibits no tenderness.  Abdominal: Soft. Bowel sounds are normal. She exhibits no distension and no  mass. There is no tenderness. There is no rebound and no guarding.  Musculoskeletal: She exhibits no edema.  Lymphadenopathy:    She has no cervical adenopathy.  Neurological: She is alert and oriented to person, place, and time. She has normal patellar reflexes. She exhibits normal muscle tone. Coordination normal.  Skin: Skin is warm and dry.  Psychiatric: She has a normal mood and affect. Her behavior is normal. Judgment and thought content normal.  Breast/pelvic: deferred           Assessment & Plan:  Assessment & Plan:   Problem List Items Addressed This Visit       Unprioritized   Preventative health care - Primary    Continue healthy diet and regular exercise. Refer to GYN for Pap. Tetanus up to date. She will be travelling to [place] and will take the vivotif vaccine prior to departure.  Hep A/B/Tetanus/MMR/polio all up to date. She was told by her travel group that malaria prophylaxis is not necessary for the area they will be traveling to.   Recommended covid/flu vaccine. She declines covid vaccination.       Relevant Orders   Ambulatory referral to Obstetrics / Gynecology    I am having Erica Brandt maintain her typhoid.  No orders of the defined types were placed in this encounter.

## 2023-07-25 ENCOUNTER — Encounter: Payer: 59 | Admitting: Family

## 2023-10-09 ENCOUNTER — Telehealth: Payer: Self-pay | Admitting: Family

## 2023-10-09 NOTE — Telephone Encounter (Signed)
Records (pap results) faxed last week. Erica Brandt confirmed records had been received.

## 2023-10-09 NOTE — Telephone Encounter (Signed)
Mya with [facility] called to follow up on records request. Mya said fax was sent on 10/04/23. Fax number confirmed. Fax not in [employer], not sure if it's already been addressed or retrieved by clinical staff. Mya will refax request. Please call to advise Mya if we already received and the status. [phone]

## 2023-12-12 DIAGNOSIS — Z01419 Encounter for gynecological examination (general) (routine) without abnormal findings: Secondary | ICD-10-CM | POA: Diagnosis not present

## 2023-12-12 DIAGNOSIS — Z1389 Encounter for screening for other disorder: Secondary | ICD-10-CM | POA: Diagnosis not present
